# Patient Record
Sex: Female | Born: 1980 | Race: White | Hispanic: No | Marital: Married | Smoking: Current every day smoker
Health system: Southern US, Academic
[De-identification: ages and names within clinical notes are randomized; demographics above are authoritative.]

## PROBLEM LIST (undated history)

## (undated) DIAGNOSIS — Z8571 Personal history of Hodgkin lymphoma: Secondary | ICD-10-CM

## (undated) DIAGNOSIS — K219 Gastro-esophageal reflux disease without esophagitis: Secondary | ICD-10-CM

## (undated) DIAGNOSIS — G40909 Epilepsy, unspecified, not intractable, without status epilepticus: Secondary | ICD-10-CM

## (undated) DIAGNOSIS — I671 Cerebral aneurysm, nonruptured: Secondary | ICD-10-CM

## (undated) DIAGNOSIS — R569 Unspecified convulsions: Secondary | ICD-10-CM

## (undated) DIAGNOSIS — C8190 Hodgkin lymphoma, unspecified, unspecified site: Secondary | ICD-10-CM

## (undated) DIAGNOSIS — E039 Hypothyroidism, unspecified: Secondary | ICD-10-CM

## (undated) DIAGNOSIS — N979 Female infertility, unspecified: Secondary | ICD-10-CM

## (undated) DIAGNOSIS — Z8041 Family history of malignant neoplasm of ovary: Secondary | ICD-10-CM

## (undated) DIAGNOSIS — E079 Disorder of thyroid, unspecified: Secondary | ICD-10-CM

## (undated) DIAGNOSIS — Q283 Other malformations of cerebral vessels: Secondary | ICD-10-CM

## (undated) DIAGNOSIS — G935 Compression of brain: Secondary | ICD-10-CM

## (undated) HISTORY — DX: Cerebral aneurysm, nonruptured: I67.1

## (undated) HISTORY — DX: Personal history of Hodgkin lymphoma: Z85.71

## (undated) HISTORY — DX: Gastro-esophageal reflux disease without esophagitis: K21.9

## (undated) HISTORY — PX: HX INTRACRANIAL ANEURYSM REPAIR: SHX164

## (undated) HISTORY — DX: Epilepsy, unspecified, not intractable, without status epilepticus (CMS HCC): G40.909

## (undated) HISTORY — DX: Hypothyroidism, unspecified: E03.9

## (undated) HISTORY — DX: Disorder of thyroid, unspecified: E07.9

## (undated) HISTORY — DX: Compression of brain: G93.5

## (undated) HISTORY — PX: THYROID SURGERY: SHX805

## (undated) HISTORY — DX: Other malformations of cerebral vessels: Q28.3

## (undated) HISTORY — PX: BRAIN SURGERY: SHX531

## (undated) HISTORY — DX: Female infertility, unspecified: N97.9

## (undated) HISTORY — DX: Family history of malignant neoplasm of ovary: Z80.41

## (undated) HISTORY — DX: Hodgkin lymphoma, unspecified, unspecified site: C81.90

## (undated) HISTORY — PX: DENTAL SURGERY: SHX609

---

## 2002-12-08 ENCOUNTER — Emergency Department (HOSPITAL_COMMUNITY): Admission: EM | Admit: 2002-12-08 | Discharge: 2002-12-08 | Payer: Self-pay | Admitting: Emergency Medicine

## 2004-07-17 ENCOUNTER — Emergency Department: Payer: Self-pay | Admitting: Unknown Physician Specialty

## 2004-07-18 ENCOUNTER — Emergency Department (HOSPITAL_COMMUNITY): Admission: EM | Admit: 2004-07-18 | Discharge: 2004-07-19 | Payer: Self-pay | Admitting: *Deleted

## 2004-08-03 ENCOUNTER — Emergency Department: Payer: Self-pay | Admitting: Emergency Medicine

## 2004-08-03 ENCOUNTER — Other Ambulatory Visit: Payer: Self-pay

## 2004-11-09 ENCOUNTER — Emergency Department: Payer: Self-pay | Admitting: Emergency Medicine

## 2004-11-10 ENCOUNTER — Ambulatory Visit: Payer: Self-pay | Admitting: Emergency Medicine

## 2004-12-20 ENCOUNTER — Emergency Department: Payer: Self-pay | Admitting: Emergency Medicine

## 2005-03-09 ENCOUNTER — Emergency Department: Payer: Self-pay | Admitting: Emergency Medicine

## 2005-03-12 ENCOUNTER — Other Ambulatory Visit: Payer: Self-pay

## 2005-03-13 ENCOUNTER — Observation Stay: Payer: Self-pay | Admitting: Internal Medicine

## 2005-09-10 ENCOUNTER — Emergency Department: Payer: Self-pay | Admitting: Emergency Medicine

## 2005-10-12 ENCOUNTER — Emergency Department: Payer: Self-pay | Admitting: Emergency Medicine

## 2006-01-11 ENCOUNTER — Ambulatory Visit: Payer: Self-pay | Admitting: Oncology

## 2006-01-26 ENCOUNTER — Ambulatory Visit: Payer: Self-pay | Admitting: Oncology

## 2006-02-02 ENCOUNTER — Ambulatory Visit: Payer: Self-pay | Admitting: Vascular Surgery

## 2006-06-09 ENCOUNTER — Ambulatory Visit: Payer: Self-pay | Admitting: Oncology

## 2006-06-28 ENCOUNTER — Ambulatory Visit: Payer: Self-pay | Admitting: Oncology

## 2006-08-09 ENCOUNTER — Emergency Department: Payer: Self-pay | Admitting: Unknown Physician Specialty

## 2007-08-22 ENCOUNTER — Ambulatory Visit: Payer: Self-pay | Admitting: Gastroenterology

## 2009-01-03 ENCOUNTER — Ambulatory Visit: Payer: Self-pay | Admitting: Family Medicine

## 2010-02-13 ENCOUNTER — Emergency Department: Payer: Self-pay | Admitting: Emergency Medicine

## 2012-02-16 ENCOUNTER — Ambulatory Visit: Payer: Self-pay | Admitting: Family Medicine

## 2013-01-07 ENCOUNTER — Emergency Department: Payer: Self-pay | Admitting: Emergency Medicine

## 2013-01-07 LAB — URINALYSIS, COMPLETE
Bacteria: NONE SEEN
Bilirubin,UR: NEGATIVE
Glucose,UR: NEGATIVE mg/dL (ref 0–75)
Ketone: NEGATIVE
Leukocyte Esterase: NEGATIVE
Nitrite: NEGATIVE
Ph: 6 (ref 4.5–8.0)
Protein: NEGATIVE
RBC,UR: 1 /HPF (ref 0–5)
Specific Gravity: 1.011 (ref 1.003–1.030)
Squamous Epithelial: <1
WBC UR: NONE SEEN /HPF (ref 0–5)

## 2013-01-07 LAB — COMPREHENSIVE METABOLIC PANEL
Albumin: 3.6 g/dL (ref 3.4–5.0)
Alkaline Phosphatase: 60 U/L (ref 50–136)
Anion Gap: 6 — ABNORMAL LOW (ref 7–16)
BUN: 6 mg/dL — ABNORMAL LOW (ref 7–18)
Bilirubin,Total: 0.2 mg/dL (ref 0.2–1.0)
Calcium, Total: 8.5 mg/dL (ref 8.5–10.1)
Chloride: 110 mmol/L — ABNORMAL HIGH (ref 98–107)
Co2: 24 mmol/L (ref 21–32)
Creatinine: 0.6 mg/dL (ref 0.60–1.30)
EGFR (African American): >60
EGFR (Non-African Amer.): >60
Glucose: 77 mg/dL (ref 65–99)
Osmolality: 276 (ref 275–301)
Potassium: 3.5 mmol/L (ref 3.5–5.1)
SGOT(AST): 15 U/L (ref 15–37)
SGPT (ALT): 13 U/L (ref 12–78)
Sodium: 140 mmol/L (ref 136–145)
Total Protein: 7 g/dL (ref 6.4–8.2)

## 2013-01-07 LAB — PROTIME-INR
INR: 1
Prothrombin Time: 13.2 secs (ref 11.5–14.7)

## 2013-01-07 LAB — CBC WITH DIFFERENTIAL/PLATELET
Basophil #: 0.1 10*3/uL (ref 0.0–0.1)
Basophil %: 0.8 %
Eosinophil #: 0.1 10*3/uL (ref 0.0–0.7)
Eosinophil %: 1.6 %
HCT: 40.9 % (ref 35.0–47.0)
HGB: 13.6 g/dL (ref 12.0–16.0)
Lymphocyte #: 2.5 10*3/uL (ref 1.0–3.6)
Lymphocyte %: 32.8 %
MCH: 31.2 pg (ref 26.0–34.0)
MCHC: 33.1 g/dL (ref 32.0–36.0)
MCV: 94 fL (ref 80–100)
Monocyte #: 0.7 x10 3/mm (ref 0.2–0.9)
Monocyte %: 8.7 %
Neutrophil #: 4.2 10*3/uL (ref 1.4–6.5)
Neutrophil %: 56.1 %
Platelet: 231 10*3/uL (ref 150–440)
RBC: 4.35 10*6/uL (ref 3.80–5.20)
RDW: 13.2 % (ref 11.5–14.5)
WBC: 7.5 10*3/uL (ref 3.6–11.0)

## 2013-01-07 LAB — DRUG SCREEN, URINE

## 2013-01-07 LAB — TSH: Thyroid Stimulating Horm: 1.43 u[IU]/mL

## 2013-01-07 LAB — PHENYTOIN LEVEL, TOTAL: Dilantin: <0.4 ug/mL — ABNORMAL LOW (ref 10.0–20.0)

## 2013-02-15 ENCOUNTER — Emergency Department: Payer: Self-pay | Admitting: Emergency Medicine

## 2013-04-30 ENCOUNTER — Emergency Department: Payer: Self-pay | Admitting: Emergency Medicine

## 2013-04-30 LAB — BASIC METABOLIC PANEL
Anion Gap: 6 — ABNORMAL LOW (ref 7–16)
BUN: 11 mg/dL (ref 7–18)
Calcium, Total: 9.5 mg/dL (ref 8.5–10.1)
Chloride: 106 mmol/L (ref 98–107)
Co2: 26 mmol/L (ref 21–32)
Creatinine: 0.69 mg/dL (ref 0.60–1.30)
EGFR (African American): >60
EGFR (Non-African Amer.): >60
Glucose: 81 mg/dL (ref 65–99)
Osmolality: 274 (ref 275–301)
Potassium: 3.8 mmol/L (ref 3.5–5.1)
Sodium: 138 mmol/L (ref 136–145)

## 2013-04-30 LAB — CBC WITH DIFFERENTIAL/PLATELET
Basophil #: 0.1 10*3/uL (ref 0.0–0.1)
Basophil %: 0.9 %
Eosinophil #: 0.3 10*3/uL (ref 0.0–0.7)
Eosinophil %: 2.6 %
HCT: 42.3 % (ref 35.0–47.0)
HGB: 14.6 g/dL (ref 12.0–16.0)
Lymphocyte #: 3.2 10*3/uL (ref 1.0–3.6)
Lymphocyte %: 30.3 %
MCH: 31.6 pg (ref 26.0–34.0)
MCHC: 34.4 g/dL (ref 32.0–36.0)
MCV: 92 fL (ref 80–100)
Monocyte #: 0.8 x10 3/mm (ref 0.2–0.9)
Monocyte %: 7.5 %
Neutrophil #: 6.2 10*3/uL (ref 1.4–6.5)
Neutrophil %: 58.7 %
Platelet: 273 10*3/uL (ref 150–440)
RBC: 4.61 10*6/uL (ref 3.80–5.20)
RDW: 13.7 % (ref 11.5–14.5)
WBC: 10.6 10*3/uL (ref 3.6–11.0)

## 2013-04-30 LAB — PHENYTOIN LEVEL, TOTAL: Dilantin: <0.4 ug/mL — ABNORMAL LOW (ref 10.0–20.0)

## 2013-08-11 DIAGNOSIS — C8190 Hodgkin lymphoma, unspecified, unspecified site: Secondary | ICD-10-CM | POA: Insufficient documentation

## 2014-11-09 ENCOUNTER — Ambulatory Visit: Payer: Self-pay | Admitting: Family Medicine

## 2015-10-09 DIAGNOSIS — G950 Syringomyelia and syringobulbia: Secondary | ICD-10-CM | POA: Insufficient documentation

## 2015-10-09 DIAGNOSIS — G935 Compression of brain: Secondary | ICD-10-CM | POA: Insufficient documentation

## 2016-01-03 DIAGNOSIS — Q283 Other malformations of cerebral vessels: Secondary | ICD-10-CM | POA: Insufficient documentation

## 2017-02-21 ENCOUNTER — Emergency Department (HOSPITAL_COMMUNITY): Payer: Self-pay

## 2017-02-21 ENCOUNTER — Emergency Department (HOSPITAL_COMMUNITY)
Admission: EM | Admit: 2017-02-21 | Discharge: 2017-02-21 | Disposition: A | Discharge status: Home / Self Care | Payer: Self-pay | Attending: Emergency Medicine | Admitting: Emergency Medicine

## 2017-02-21 ENCOUNTER — Encounter (HOSPITAL_COMMUNITY): Payer: Self-pay

## 2017-02-21 DIAGNOSIS — Z79899 Other long term (current) drug therapy: Secondary | ICD-10-CM | POA: Insufficient documentation

## 2017-02-21 DIAGNOSIS — R531 Weakness: Secondary | ICD-10-CM

## 2017-02-21 DIAGNOSIS — Z5181 Encounter for therapeutic drug level monitoring: Secondary | ICD-10-CM | POA: Insufficient documentation

## 2017-02-21 DIAGNOSIS — R2681 Unsteadiness on feet: Secondary | ICD-10-CM

## 2017-02-21 HISTORY — DX: Unspecified convulsions: R56.9

## 2017-02-21 LAB — CBC
HCT: 41.5 % (ref 36.0–46.0)
Hemoglobin: 13.9 g/dL (ref 12.0–15.0)
MCH: 32.9 pg (ref 26.0–34.0)
MCHC: 33.5 g/dL (ref 30.0–36.0)
MCV: 98.1 fL (ref 78.0–100.0)
Platelets: 202 10*3/uL (ref 150–400)
RBC: 4.23 MIL/uL (ref 3.87–5.11)
RDW: 13.3 % (ref 11.5–15.5)
WBC: 9.8 10*3/uL (ref 4.0–10.5)

## 2017-02-21 LAB — PROTIME-INR
INR: 1
Prothrombin Time: 13.2 seconds (ref 11.4–15.2)

## 2017-02-21 LAB — URINALYSIS, ROUTINE W REFLEX MICROSCOPIC
Bilirubin Urine: NEGATIVE
Glucose, UA: NEGATIVE mg/dL
Ketones, ur: NEGATIVE mg/dL
Leukocytes, UA: NEGATIVE
Nitrite: NEGATIVE
Protein, ur: NEGATIVE mg/dL
Specific Gravity, Urine: 1.008 (ref 1.005–1.030)
pH: 6 (ref 5.0–8.0)

## 2017-02-21 LAB — RAPID URINE DRUG SCREEN, HOSP PERFORMED
Amphetamines: NOT DETECTED
Barbiturates: NOT DETECTED
Benzodiazepines: NOT DETECTED
Cocaine: NOT DETECTED
Opiates: NOT DETECTED
Tetrahydrocannabinol: NOT DETECTED

## 2017-02-21 LAB — DIFFERENTIAL
Basophils Absolute: 0 10*3/uL (ref 0.0–0.1)
Basophils Relative: 0 %
Eosinophils Absolute: 0.2 10*3/uL (ref 0.0–0.7)
Eosinophils Relative: 2 %
Lymphocytes Relative: 28 %
Lymphs Abs: 2.7 10*3/uL (ref 0.7–4.0)
Monocytes Absolute: 0.6 10*3/uL (ref 0.1–1.0)
Monocytes Relative: 6 %
Neutro Abs: 6.3 10*3/uL (ref 1.7–7.7)
Neutrophils Relative %: 64 %

## 2017-02-21 LAB — COMPREHENSIVE METABOLIC PANEL
ALT: 9 U/L — ABNORMAL LOW (ref 14–54)
AST: 15 U/L (ref 15–41)
Albumin: 3.7 g/dL (ref 3.5–5.0)
Alkaline Phosphatase: 48 U/L (ref 38–126)
Anion gap: 8 (ref 5–15)
BUN: 6 mg/dL (ref 6–20)
CO2: 23 mmol/L (ref 22–32)
Calcium: 9.1 mg/dL (ref 8.9–10.3)
Chloride: 111 mmol/L (ref 101–111)
Creatinine, Ser: 0.71 mg/dL (ref 0.44–1.00)
GFR calc Af Amer: >60 mL/min (ref >60)
GFR calc non Af Amer: >60 mL/min (ref >60)
Glucose, Bld: 94 mg/dL (ref 65–99)
Potassium: 3.5 mmol/L (ref 3.5–5.1)
Sodium: 142 mmol/L (ref 135–145)
Total Bilirubin: 0.3 mg/dL (ref 0.3–1.2)
Total Protein: 6.5 g/dL (ref 6.5–8.1)

## 2017-02-21 LAB — I-STAT CHEM 8, ED
BUN: 8 mg/dL (ref 6–20)
Calcium, Ion: 1.15 mmol/L (ref 1.15–1.40)
Chloride: 109 mmol/L (ref 101–111)
Creatinine, Ser: 0.6 mg/dL (ref 0.44–1.00)
Glucose, Bld: 85 mg/dL (ref 65–99)
HCT: 42 % (ref 36.0–46.0)
Hemoglobin: 14.3 g/dL (ref 12.0–15.0)
Potassium: 3.6 mmol/L (ref 3.5–5.1)
Sodium: 142 mmol/L (ref 135–145)
TCO2: 26 mmol/L (ref 0–100)

## 2017-02-21 LAB — PREGNANCY, URINE: Preg Test, Ur: NEGATIVE

## 2017-02-21 LAB — I-STAT TROPONIN, ED: Troponin i, poc: 0 ng/mL (ref 0.00–0.08)

## 2017-02-21 LAB — APTT: aPTT: 28 seconds (ref 24–36)

## 2017-02-21 LAB — PHENYTOIN LEVEL, TOTAL: Phenytoin Lvl: <2.5 ug/mL — ABNORMAL LOW (ref 10.0–20.0)

## 2017-02-21 LAB — I-STAT CG4 LACTIC ACID, ED: Lactic Acid, Venous: 0.85 mmol/L (ref 0.5–1.9)

## 2017-02-21 MED ORDER — PROCHLORPERAZINE EDISYLATE 5 MG/ML IJ SOLN
10.0000 mg | Freq: Once | INTRAMUSCULAR | Status: AC
  Administered 2017-02-21: 10 mg via INTRAVENOUS
  Filled 2017-02-21: qty 2

## 2017-02-21 MED ORDER — MIDAZOLAM HCL 2 MG/2ML IJ SOLN
3.0000 mg | Freq: Once | INTRAMUSCULAR | Status: DC
  Filled 2017-02-21: qty 4

## 2017-02-21 MED ORDER — AMMONIA AROMATIC IN INHA
RESPIRATORY_TRACT | Status: AC
  Filled 2017-02-21: qty 10

## 2017-02-21 MED ORDER — IOPAMIDOL (ISOVUE-370) INJECTION 76%
INTRAVENOUS | Status: AC
  Filled 2017-02-21: qty 50

## 2017-02-21 MED ORDER — KETOROLAC TROMETHAMINE 30 MG/ML IJ SOLN
30.0000 mg | Freq: Once | INTRAMUSCULAR | Status: AC
  Administered 2017-02-21: 30 mg via INTRAVENOUS
  Filled 2017-02-21: qty 1

## 2017-02-21 NOTE — ED Triage Notes (Signed)
Per EMS: Pt complaining of L sided weakness and slurred speech. Pt states "feels funny." Pt a/o x 4 upon arrival. Per EMS: pt had 2 seizures enroute.

## 2017-02-21 NOTE — ED Provider Notes (Signed)
[place] DEPT Provider Note   CSN: [id number] Arrival date & time: 02/21/17  1558 Seen on arrival to exam room. Level V caveat, acuity of situation code stroke  An emergency department physician performed an initial assessment on this suspected stroke patient at 42.  History   Chief Complaint Chief Complaint  Patient presents with  . Code Stroke    HPI Gina Greer is a 36 y.o. female.Patient reports she was normal until approximately 1 PM today when she developed headache and had recent "" seizure lasting several seconds and developed left-sided weakness. No treatment prior to coming here. EMS reports patient had 2 seizures en route Associated symptoms include photophobia.  HPI  Past Medical History:  Diagnosis Date  . Seizures ([address])     There are no active problems to display for this patient.   No past surgical history on file.  OB History    No data available       Home Medications    Prior to Admission medications   Not on File    Family History No family history on file.  Social History Social History  Substance Use Topics  . Smoking status: Not on file  . Smokeless tobacco: Not on file  . Alcohol use Not on file   Positive smoker no alcohol no drugs  Allergies   Patient has no allergy information on record.   Review of Systems Review of Systems  Unable to perform ROS: Acuity of condition  Eyes: Positive for photophobia.  Neurological: Positive for headaches.     Physical Exam Updated Vital Signs BP 128/85   Pulse 64   Resp 17   SpO2 98%   Physical Exam  Constitutional: She appears well-developed and well-nourished.  Appears mildly anxious  HENT:  Head: Normocephalic and atraumatic.  Eyes: Conjunctivae are normal. Pupils are equal, round, and reactive to light.  Neck: Neck supple. No tracheal deviation present. No thyromegaly present.  Cardiovascular: Normal rate and regular rhythm.   No murmur heard. Pulmonary/Chest:  Effort normal and breath sounds normal.  Abdominal: Soft. Bowel sounds are normal. She exhibits no distension. There is no tenderness.  Musculoskeletal: Normal range of motion. She exhibits no edema or tenderness.  Neurological: She is alert. Coordination normal.  Finger-nose normal DTR symmetric bilaterally at knee jerk ankle jerk and biceps toes to order bilaterally at speech slightly slurred.  Skin: Skin is warm and dry. No rash noted.  Nursing note and vitals reviewed.   5 PM I attempted to ambulate patient. Gait very unsteady  5:30 PM I continued to ambulate patient she is somewhat sleepy arousable easily gentle tactile stimulus. Gait continues to be unsteady. MRI of brain ordered ED Treatments / Results  Labs (all labs ordered are listed, but only abnormal results are displayed) Labs Reviewed  PROTIME-INR  APTT  CBC  DIFFERENTIAL  COMPREHENSIVE METABOLIC PANEL  I-STAT [place], ED  CBG MONITORING, ED  I-STAT CHEM 8, ED    EKG  EKG Interpretation  Date/Time:  Sunday Feb 21 2017 16:26:49 EDT Ventricular Rate:  59 PR Interval:    QRS Duration: 92 QT Interval:  419 QTC Calculation: 415 R Axis:   49 Text Interpretation:  Sinus rhythm Baseline wander in lead(s) I II aVR No significant change since last tracing Confirmed by Orlie Dakin [phone]) on 02/21/2017 11:13:52 PM      Results for orders placed or performed during the hospital encounter of 02/21/17  Protime-INR  Result Value Ref Range  Prothrombin Time 13.2 11.4 - 15.2 seconds   INR 1.00   APTT  Result Value Ref Range   aPTT 28 24 - 36 seconds  CBC  Result Value Ref Range   WBC 9.8 4.0 - 10.5 K/uL   RBC 4.23 3.87 - 5.11 MIL/uL   Hemoglobin 13.9 12.0 - 15.0 g/dL   HCT 41.5 36.0 - 46.0 %   MCV 98.1 78.0 - 100.0 fL   MCH 32.9 26.0 - 34.0 pg   MCHC 33.5 30.0 - 36.0 g/dL   RDW 13.3 11.5 - 15.5 %   Platelets 202 150 - 400 K/uL  Differential  Result Value Ref Range   Neutrophils Relative % 64 %   Neutro  Abs 6.3 1.7 - 7.7 K/uL   Lymphocytes Relative 28 %   Lymphs Abs 2.7 0.7 - 4.0 K/uL   Monocytes Relative 6 %   Monocytes Absolute 0.6 0.1 - 1.0 K/uL   Eosinophils Relative 2 %   Eosinophils Absolute 0.2 0.0 - 0.7 K/uL   Basophils Relative 0 %   Basophils Absolute 0.0 0.0 - 0.1 K/uL  Comprehensive metabolic panel  Result Value Ref Range   Sodium 142 135 - 145 mmol/L   Potassium 3.5 3.5 - 5.1 mmol/L   Chloride 111 101 - 111 mmol/L   CO2 23 22 - 32 mmol/L   Glucose, Bld 94 65 - 99 mg/dL   BUN 6 6 - 20 mg/dL   Creatinine, Ser 0.71 0.44 - 1.00 mg/dL   Calcium 9.1 8.9 - 10.3 mg/dL   Total Protein 6.5 6.5 - 8.1 g/dL   Albumin 3.7 3.5 - 5.0 g/dL   AST 15 15 - 41 U/L   ALT 9 (L) 14 - 54 U/L   Alkaline Phosphatase 48 38 - 126 U/L   Total Bilirubin 0.3 0.3 - 1.2 mg/dL   GFR calc non Af Amer >60 >60 mL/min   GFR calc Af Amer >60 >60 mL/min   Anion gap 8 5 - 15  Phenytoin level, total  Result Value Ref Range   Phenytoin Lvl <2.5 (L) 10.0 - 20.0 ug/mL  Rapid urine drug screen (hospital performed)  Result Value Ref Range   Opiates NONE DETECTED NONE DETECTED   Cocaine NONE DETECTED NONE DETECTED   Benzodiazepines NONE DETECTED NONE DETECTED   Amphetamines NONE DETECTED NONE DETECTED   Tetrahydrocannabinol NONE DETECTED NONE DETECTED   Barbiturates NONE DETECTED NONE DETECTED  Pregnancy, urine  Result Value Ref Range   Preg Test, Ur NEGATIVE NEGATIVE  Urinalysis, Routine w reflex microscopic  Result Value Ref Range   Color, Urine YELLOW YELLOW   APPearance CLEAR CLEAR   Specific Gravity, Urine 1.008 1.005 - 1.030   pH 6.0 5.0 - 8.0   Glucose, UA NEGATIVE NEGATIVE mg/dL   Hgb urine dipstick MODERATE (A) NEGATIVE   Bilirubin Urine NEGATIVE NEGATIVE   Ketones, ur NEGATIVE NEGATIVE mg/dL   Protein, ur NEGATIVE NEGATIVE mg/dL   Nitrite NEGATIVE NEGATIVE   Leukocytes, UA NEGATIVE NEGATIVE   RBC / HPF 0-5 0 - 5 RBC/hpf   WBC, UA 0-5 0 - 5 WBC/hpf   Bacteria, UA RARE (A) NONE SEEN    Squamous Epithelial / LPF 0-5 (A) NONE SEEN  I-stat troponin, ED  Result Value Ref Range   Troponin i, poc 0.00 0.00 - 0.08 ng/mL   Comment 3          I-Stat Chem 8, ED  Result Value Ref Range   Sodium 142 135 -  145 mmol/L   Potassium 3.6 3.5 - 5.1 mmol/L   Chloride 109 101 - 111 mmol/L   BUN 8 6 - 20 mg/dL   Creatinine, Ser 0.60 0.44 - 1.00 mg/dL   Glucose, Bld 85 65 - 99 mg/dL   Calcium, Ion 1.15 1.15 - 1.40 mmol/L   TCO2 26 0 - 100 mmol/L   Hemoglobin 14.3 12.0 - 15.0 g/dL   HCT 42.0 36.0 - 46.0 %  I-Stat CG4 Lactic Acid, ED  Result Value Ref Range   Lactic Acid, Venous 0.85 0.5 - 1.9 mmol/L   Mr Brain Wo Contrast  Result Date: 02/21/2017 CLINICAL DATA:  Possible seizure.  Headache.  Unsteady gait. EXAM: MRI HEAD WITHOUT CONTRAST TECHNIQUE: Multiplanar, multiecho pulse sequences of the brain and surrounding structures were obtained without intravenous contrast. COMPARISON:  Head CT 02/21/2017.  Neck CT 11/10/2004. FINDINGS: Thin section coronal oblique T2 weighted sequence through the temporal lobes was not obtained as the patient could not tolerate any additional imaging. Brain: There is no evidence of acute infarct, midline shift, or extra-axial fluid collection. The ventricles and sulci are normal. Corresponding to the hyperdense lesion in the right lentiform nucleus on CT is a 9 mm T2 hyperintense mass with complete hemosiderin ring consistent with a cavernoma. There is no surrounding edema. Thin tubular focus of low T1 signal extending from the cavernoma superiorly towards the right lateral ventricle likely represents an associated developmental venous anomaly, partially included on the prior neck CT. A small right-sided choroidal fissure cyst is incidentally noted. The brain is otherwise normal in signal. Vascular: Major intracranial vascular flow voids are preserved. Skull and upper cervical spine: Prior suboccipital craniectomy. Sinuses/Orbits: Unremarkable orbits. Mild bilateral  ethmoid air cell mucosal thickening. Clear mastoid air cells. Other: None. IMPRESSION: 1. No acute intracranial abnormality. 2. Right basal ganglia cavernoma. Electronically Signed   By: Logan Bores M.D.   On: 02/21/2017 21:28   Ct Head Code Stroke W/o Cm  Result Date: 02/21/2017 CLINICAL DATA:  Code stroke. Slurred speech. Seizure like activity. Symptoms not clearly localizing to 1 cerebral hemisphere. Known cavernoma. EXAM: CT HEAD WITHOUT CONTRAST TECHNIQUE: Contiguous axial images were obtained from the base of the skull through the vertex without intravenous contrast. COMPARISON:  01/07/2013 head CT.  11/10/2004 neck CT. FINDINGS: Brain: There is a 9 mm round hyperdense focus involving the right lentiform nucleus. A small amount of faint density was present in this location on the prior head CT, and prominent branching vascularity was partially evident in this region on the prior neck CT, with overall appearance most compatible with a cavernoma and developmental venous anomaly. There is no evidence of significant surrounding edema. There is no evidence of acute cortically based infarct, midline shift, or extra-axial fluid collection. The ventricles and sulci are normal. Vascular: No hyperdense vessel. Skull: Interval suboccipital craniectomy. Sinuses/Orbits: Mild bilateral ethmoid air cell mucosal thickening. Clear mastoid air cells. Unremarkable orbits. Other: None. ASPECTS [facility] Stroke Program Early CT Score) Not scored to to lack of localizing symptoms. IMPRESSION: 1. No acute intracranial abnormality identified. 2. 9 mm hyperdense mass in the right basal ganglia compatible with the known cavernoma. These results were called by telephone at the time of interpretation on 02/21/2017 at 4:28 pm to Dr. Leonel Ramsay, who verbally acknowledged these results. Electronically Signed   By: Logan Bores M.D.   On: 02/21/2017 16:30   Radiology Ct Head Code Stroke W/o Cm  Result Date: 02/21/2017 CLINICAL DATA:   Code stroke. Slurred speech.  Seizure like activity. Symptoms not clearly localizing to 1 cerebral hemisphere. Known cavernoma. EXAM: CT HEAD WITHOUT CONTRAST TECHNIQUE: Contiguous axial images were obtained from the base of the skull through the vertex without intravenous contrast. COMPARISON:  01/07/2013 head CT.  11/10/2004 neck CT. FINDINGS: Brain: There is a 9 mm round hyperdense focus involving the right lentiform nucleus. A small amount of faint density was present in this location on the prior head CT, and prominent branching vascularity was partially evident in this region on the prior neck CT, with overall appearance most compatible with a cavernoma and developmental venous anomaly. There is no evidence of significant surrounding edema. There is no evidence of acute cortically based infarct, midline shift, or extra-axial fluid collection. The ventricles and sulci are normal. Vascular: No hyperdense vessel. Skull: Interval suboccipital craniectomy. Sinuses/Orbits: Mild bilateral ethmoid air cell mucosal thickening. Clear mastoid air cells. Unremarkable orbits. Other: None. ASPECTS [facility] Stroke Program Early CT Score) Not scored to to lack of localizing symptoms. IMPRESSION: 1. No acute intracranial abnormality identified. 2. 9 mm hyperdense mass in the right basal ganglia compatible with the known cavernoma. These results were called by telephone at the time of interpretation on 02/21/2017 at 4:28 pm to Dr. Leonel Ramsay, who verbally acknowledged these results. Electronically Signed   By: Logan Bores M.D.   On: 02/21/2017 16:30    Procedures Procedures (including critical care time)  Medications Ordered in ED Medications  ammonia inhalant (not administered)  prochlorperazine (COMPAZINE) injection 10 mg (10 mg Intravenous Given 02/21/17 1637)  ketorolac (TORADOL) 30 MG/ML injection 30 mg (30 mg Intravenous Given 02/21/17 1637)     Initial Impression / Assessment and Plan / ED Course  I have  reviewed the triage vital signs and the nursing notes.  Pertinent labs & imaging results that were available during my care of the patient were reviewed by me and considered in my medical decision making (see chart for details).     10:05 PM patient is awake alert Glasgow Coma Score 15 ambulates without difficulty Suspect conversion reaction versus migraine. Plan follow-up with [facility] clinic as needed Final Clinical Impressions(s) / ED Diagnoses  Diagnosis #1 weakness #2 headache Final diagnoses:  None    New Prescriptions New Prescriptions   No medications on file     Orlie Dakin, MD 02/21/17 2314

## 2017-02-21 NOTE — Consult Note (Signed)
Neurology Consultation Reason for Consult: Possible seizures Referring Physician: Lyn Hollingshead  CC: Possible seizures  History is obtained from: EMS patient  HPI: Gina Greer is a 36 y.o. female with a history of seizures, cavernous malformation, previous Chiari I status post surgery, Hodgkin's lymphoma who presents with "feeling weird" since sometime before work which started at 58 AM. She states that she continue feeling worse, and then EMS was called. After EMS arrival, she had 2 brief episodes of arm contraction lasting about 15 seconds with immediate return to consciousness which EMS reported as "seizures." She then began to develop left-sided weakness and therefore a code stroke was called.  On arrival, she had a markedly inconsistent exam with multiple findings concerning for nonorganic etiology of her symptoms. CT head was negative for acute findings.  Of note, she has a headache associated with photophobia.  LKW: Unclear but likely before 11 AM tpa given?: no, outside of window, seizures at onset    ROS: A 14 point ROS was performed and is negative except as noted in the HPI.   Past Medical History:  Diagnosis Date  . Seizures ([place])      Family history: No history of similar   Social History:  has no tobacco, alcohol, and drug history on file.   Exam: Current vital signs: BP (!) 113/59 (BP Location: Right Arm)   Pulse 68   Resp 13   SpO2 99%  Vital signs in last 24 hours: Pulse Rate:  [68-69] 68 (05/27 1630) Resp:  [13-18] 13 (05/27 1630) BP: (103-113)/(59-84) 113/59 (05/27 1630) SpO2:  [99 %] 99 % (05/27 1628)   Physical Exam  Constitutional: Appears well-developed and well-nourished.  Psych: Affect appropriate to situation Eyes: No scleral injection HENT: No OP obstrucion Head: Normocephalic.  Cardiovascular: Normal rate and regular rhythm.  Respiratory: Effort normal and breath sounds normal to anterior ascultation GI: Soft.  No distension. There is  no tenderness.  Skin: WDI  Neuro: Mental Status: Patient is awake, alert, oriented to person, place, month, year, and situation. No signs of aphasia or neglect Cranial Nerves: II: Visual Fields are full. Pupils are equal, round, and reactive to light.   III,IV, VI: EOMI without ptosis or diploplia. She keeps her eyes initially closed, and then refuses to follow with her eyes, but after multiple attempts it is clear that she does have full extraocular movements. V: Facial sensation is diminished on the left side, splits midline to pinprick on the for head VII: She initially does not smile, and hesitates with moving her left side of her face but subsequently does clearly have full strength bilaterally. VIII: hearing is intact to voice X: Uvula elevates symmetrically XI: Shoulder shrug is symmetric. XII: tongue is midline without atrophy or fasciculations.  Motor: Tone is normal. Bulk is normal. When tested directly, she gives 3/5 strength of the left arm, however with testing finger-nose-finger, she is able to perform it well without drift. Sensory: She reports decreased sensation throughout the left side including splitting the midline to pinprick on the for head Cerebellar: No ataxia on finger nose finger on either side  She repeatedly has jaw tremor which is distractible.   I have reviewed labs in epic and the results pertinent to this consultation are:   I have reviewed the images obtained: CT head-known right basal ganglia cavernous malformation  Impression: 36 year old female with findings concerning for nonorganic etiology. I do wonder if she is having a migraine with embellishment and therefore will pursue  treatment of her headache with encouragement to improve.  Recommendations: 1) Compazine/Toradol(she reports allergy to Benadryl) 2) I would encourage improvement 3) I don't think that further evaluation is needed based on her exam findings.   Roland Rack,  MD [facility] [phone]  If 7pm- 7am, please page neurology on call as listed in [place].

## 2017-02-21 NOTE — Code Documentation (Signed)
36 y.o. Female with PMHx of seizures who was stated to be in her normal state of health prior to arriving at work today at 1100. EMS reports that she told her supervisor around 1245 that she "felt weird" and went to sit down. Her coworkers noted that she had left sided weakness and an unsteady gait. EMS was notified and appreciated dysarthria, headache and reports hx of aneurysm, LUE numbness and weakness. EMS also reporting about 15 second long seizure type activity.   On arrival, patient was met at the bridge by the stroke team, labs were drawn and patient taken to CT. CT showing no acute intracranial abnormalities. ASPECTS 10. 9 mm hyperdense mass in the right basal ganglia compatible with a known cavernoma. NIHSS 4. VAN (-). See EMR for NIHSS and code stroke times. On assessment, patient states she felt bad prior to going to work at 1100 and then latter stetes she felt bad when she woke up this morning. Once back in the ED, she reports being normal when she arrived at work. LKW unable to be determined. Husband is stated to be on the way to [facility] ED. Patient drowsy with poor effort on exam. Mild dysarthria, drift to the LUE, numbness to her left face, arm and leg described by the patient as her left side "feels asleep". Patient reports headache with 6/10 pain. See pain assessment in EMR for detailed pain assessment. tPA not given d/t being out of the window. Not a thrombectomy candidate d/t being VAN (-). ED bedside handoff with ED RN Gabe.

## 2017-02-21 NOTE — Discharge Instructions (Signed)
Follow-up with [facility] as needed. MRI today showed no evidence stroke. All other tests checked out okay. Return if concern for any reason

## 2018-01-30 DIAGNOSIS — Z79899 Other long term (current) drug therapy: Secondary | ICD-10-CM | POA: Insufficient documentation

## 2018-01-30 DIAGNOSIS — N390 Urinary tract infection, site not specified: Secondary | ICD-10-CM | POA: Insufficient documentation

## 2018-01-31 ENCOUNTER — Emergency Department
Admission: EM | Admit: 2018-01-31 | Discharge: 2018-01-31 | Disposition: A | Discharge status: Home / Self Care | Payer: Self-pay | Attending: Emergency Medicine | Admitting: Emergency Medicine

## 2018-01-31 ENCOUNTER — Emergency Department: Payer: Self-pay

## 2018-01-31 ENCOUNTER — Other Ambulatory Visit: Payer: Self-pay

## 2018-01-31 DIAGNOSIS — R1030 Lower abdominal pain, unspecified: Secondary | ICD-10-CM

## 2018-01-31 DIAGNOSIS — N390 Urinary tract infection, site not specified: Secondary | ICD-10-CM

## 2018-01-31 DIAGNOSIS — R52 Pain, unspecified: Secondary | ICD-10-CM

## 2018-01-31 LAB — URINALYSIS, COMPLETE (UACMP) WITH MICROSCOPIC
Bilirubin Urine: NEGATIVE
Glucose, UA: NEGATIVE mg/dL
Ketones, ur: NEGATIVE mg/dL
Nitrite: NEGATIVE
Protein, ur: 30 mg/dL — AB
RBC / HPF: >50 RBC/hpf — ABNORMAL HIGH (ref 0–5)
Specific Gravity, Urine: 1.028 (ref 1.005–1.030)
pH: 5 (ref 5.0–8.0)

## 2018-01-31 LAB — COMPREHENSIVE METABOLIC PANEL
ALT: 15 U/L (ref 14–54)
AST: 22 U/L (ref 15–41)
Albumin: 3.9 g/dL (ref 3.5–5.0)
Alkaline Phosphatase: 50 U/L (ref 38–126)
Anion gap: 8 (ref 5–15)
BUN: 11 mg/dL (ref 6–20)
CO2: 24 mmol/L (ref 22–32)
Calcium: 8.8 mg/dL — ABNORMAL LOW (ref 8.9–10.3)
Chloride: 107 mmol/L (ref 101–111)
Creatinine, Ser: 0.75 mg/dL (ref 0.44–1.00)
GFR calc Af Amer: >60 mL/min (ref >60)
GFR calc non Af Amer: >60 mL/min (ref >60)
Glucose, Bld: 98 mg/dL (ref 65–99)
Potassium: 3.3 mmol/L — ABNORMAL LOW (ref 3.5–5.1)
Sodium: 139 mmol/L (ref 135–145)
Total Bilirubin: 0.2 mg/dL — ABNORMAL LOW (ref 0.3–1.2)
Total Protein: 7.3 g/dL (ref 6.5–8.1)

## 2018-01-31 LAB — LIPASE, BLOOD: Lipase: 29 U/L (ref 11–51)

## 2018-01-31 LAB — CBC
HCT: 43.8 % (ref 35.0–47.0)
Hemoglobin: 14.7 g/dL (ref 12.0–16.0)
MCH: 33.2 pg (ref 26.0–34.0)
MCHC: 33.6 g/dL (ref 32.0–36.0)
MCV: 98.9 fL (ref 80.0–100.0)
Platelets: 179 10*3/uL (ref 150–440)
RBC: 4.43 MIL/uL (ref 3.80–5.20)
RDW: 13.6 % (ref 11.5–14.5)
WBC: 9.8 10*3/uL (ref 3.6–11.0)

## 2018-01-31 LAB — POCT PREGNANCY, URINE: Preg Test, Ur: NEGATIVE

## 2018-01-31 MED ORDER — ONDANSETRON HCL 4 MG/2ML IJ SOLN
4.0000 mg | Freq: Once | INTRAMUSCULAR | Status: AC
  Administered 2018-01-31: 4 mg via INTRAVENOUS

## 2018-01-31 MED ORDER — FENTANYL CITRATE (PF) 100 MCG/2ML IJ SOLN
50.0000 ug | Freq: Once | INTRAMUSCULAR | Status: AC
  Administered 2018-01-31: 50 ug via INTRAVENOUS
  Filled 2018-01-31: qty 2

## 2018-01-31 MED ORDER — ONDANSETRON HCL 4 MG/2ML IJ SOLN
4.0000 mg | Freq: Once | INTRAMUSCULAR | Status: DC | PRN
  Filled 2018-01-31: qty 2

## 2018-01-31 MED ORDER — FOSFOMYCIN TROMETHAMINE 3 G PO PACK
3.0000 g | PACK | Freq: Once | ORAL | Status: AC
  Administered 2018-01-31: 3 g via ORAL
  Filled 2018-01-31: qty 3

## 2018-01-31 MED ORDER — SODIUM CHLORIDE 0.9 % IV BOLUS
1000.0000 mL | Freq: Once | INTRAVENOUS | Status: AC
  Administered 2018-01-31: 1000 mL via INTRAVENOUS

## 2018-01-31 NOTE — ED Notes (Signed)
Patient transported to [facility] 

## 2018-01-31 NOTE — ED Provider Notes (Signed)
[facility] Emergency Department Provider Note   ____________________________________________   First MD Initiated Contact with Patient 01/31/18 0014     (approximate)  I have reviewed the triage vital signs and the nursing notes.   HISTORY  Chief Complaint Abdominal Pain    HPI Gina Greer is a 37 y.o. female who presents to the ED from home with a chief complaint of abdominal pain.  Patient reports sudden onset of right lower quadrant abdominal pain approximately 45 minutes prior to arrival.  Pain feels better when she puts pressure on it.  States she was in her normal state of health at work and getting ready for bed.  Has a remote history of ovarian cyst requiring surgery.  Denies recent fever, chills, chest pain, shortness of breath, nausea, vomiting, hematuria, diarrhea.  Denies recent travel or trauma.   Past Medical History:  Diagnosis Date  . Seizures ([place])     There are no active problems to display for this patient.    Prior to Admission medications   Medication Sig Start Date End Date Taking? Authorizing Provider  aspirin-acetaminophen-caffeine (EXCEDRIN MIGRAINE) [phone] MG tablet Take by mouth every 6 (six) hours as needed for headache.    [provider]  LEVOTHYROXINE SODIUM PO Take by mouth.    [provider]    Allergies Benadryl  [diphenhydramine] and Diazepam  No family history on file.  Social History Social History   Tobacco Use  . Smoking status: Not on file  Substance Use Topics  . Alcohol use: Not on file  . Drug use: Not on file    Review of Systems  Constitutional: No fever/chills. Eyes: No visual changes. ENT: No sore throat. Cardiovascular: Denies chest pain. Respiratory: Denies shortness of breath. Gastrointestinal: Positive for abdominal pain.  No nausea, no vomiting.  No diarrhea.  No constipation. Genitourinary: Negative for dysuria. Musculoskeletal: Negative for back  pain. Skin: Negative for rash. Neurological: Negative for headaches, focal weakness or numbness.   ____________________________________________   PHYSICAL EXAM:  VITAL SIGNS: ED Triage Vitals [01/31/18 0007]  Enc Vitals Group     BP (!) 153/82     Pulse Rate 77     Resp 18     Temp 97.6 F (36.4 C)     Temp Source Oral     SpO2 100 %     Weight 171 lb (77.6 kg)     Height 5\' 1"  (1.549 m)     Head Circumference      Peak Flow      Pain Score 9     Pain Loc      Pain Edu?      Excl. in [place]?     Constitutional: Alert and oriented. Well appearing and in moderate acute distress. Eyes: Conjunctivae are normal. PERRL. EOMI. Head: Atraumatic. Nose: No congestion/rhinnorhea. Mouth/Throat: Mucous membranes are moist.  Oropharynx non-erythematous. Neck: No stridor.   Cardiovascular: Normal rate, regular rhythm. Grossly normal heart sounds.  Good peripheral circulation. Respiratory: Normal respiratory effort.  No retractions. Lungs CTAB. Gastrointestinal: Soft and mildly tender to palpation right upper quadrant, umbilicus and right lower quadrant without rebound or guarding. No distention. No abdominal bruits.  Mild right CVA tenderness. Musculoskeletal: No lower extremity tenderness nor edema.  No joint effusions. Neurologic:  Normal speech and language. No gross focal neurologic deficits are appreciated. No gait instability. Skin:  Skin is warm, dry and intact. No rash noted. Psychiatric: Mood and affect are normal. Speech and  behavior are normal.  ____________________________________________   LABS (all labs ordered are listed, but only abnormal results are displayed)  Labs Reviewed  COMPREHENSIVE METABOLIC PANEL - Abnormal; Notable for the following components:      Result Value   Potassium 3.3 (*)    Calcium 8.8 (*)    Total Bilirubin 0.2 (*)    All other components within normal limits  URINALYSIS, COMPLETE (UACMP) WITH MICROSCOPIC - Abnormal; Notable for the  following components:   Color, Urine YELLOW (*)    APPearance CLOUDY (*)    Hgb urine dipstick LARGE (*)    Protein, ur 30 (*)    Leukocytes, UA TRACE (*)    RBC / HPF >50 (*)    Bacteria, UA RARE (*)    All other components within normal limits  LIPASE, BLOOD  CBC  POC URINE PREG, ED  POCT PREGNANCY, URINE   ____________________________________________  EKG  None ____________________________________________  RADIOLOGY  ED MD interpretation: CT scan unremarkable.  8 mm appendix without inflammatory changes.  Pelvic ultrasound unremarkable.   Official radiology report(s): US Pelvis Transvanginal Non-ob (tv Only)  Result Date: 01/31/2018 CLINICAL DATA:  37 year old female with right adnexal pain. EXAM: TRANSABDOMINAL AND TRANSVAGINAL ULTRASOUND OF PELVIS DOPPLER ULTRASOUND OF OVARIES TECHNIQUE: Both transabdominal and transvaginal ultrasound examinations of the pelvis were performed. Transabdominal technique was performed for global imaging of the pelvis including uterus, ovaries, adnexal regions, and pelvic cul-de-sac. It was necessary to proceed with endovaginal exam following the transabdominal exam to visualize the endometrium and ovaries. Color and duplex Doppler ultrasound was utilized to evaluate blood flow to the ovaries. COMPARISON:  CT of the abdomen pelvis dated 01/31/2018 FINDINGS: Uterus Measurements: 5.5 x 3.1 x 4.2 cm. No fibroids or other mass visualized. Endometrium Thickness: 4 mm.  No focal abnormality visualized. Right ovary Measurements: 3.2 x 1.8 x 2.4 cm. Normal appearance/no adnexal mass. Left ovary Measurements: 3.0 x 2.0 x 1.8 cm. Normal appearance/no adnexal mass. Pulsed Doppler evaluation of both ovaries demonstrates normal low-resistance arterial and venous waveforms. Other findings No abnormal free fluid. IMPRESSION: Unremarkable pelvic ultrasound. Doppler detected arterial and venous flow to both ovaries. Electronically Signed   By: Anner Crete M.D.    On: 01/31/2018 03:51   US Pelvis Complete  Result Date: 01/31/2018 CLINICAL DATA:  37 year old female with right adnexal pain. EXAM: TRANSABDOMINAL AND TRANSVAGINAL ULTRASOUND OF PELVIS DOPPLER ULTRASOUND OF OVARIES TECHNIQUE: Both transabdominal and transvaginal ultrasound examinations of the pelvis were performed. Transabdominal technique was performed for global imaging of the pelvis including uterus, ovaries, adnexal regions, and pelvic cul-de-sac. It was necessary to proceed with endovaginal exam following the transabdominal exam to visualize the endometrium and ovaries. Color and duplex Doppler ultrasound was utilized to evaluate blood flow to the ovaries. COMPARISON:  CT of the abdomen pelvis dated 01/31/2018 FINDINGS: Uterus Measurements: 5.5 x 3.1 x 4.2 cm. No fibroids or other mass visualized. Endometrium Thickness: 4 mm.  No focal abnormality visualized. Right ovary Measurements: 3.2 x 1.8 x 2.4 cm. Normal appearance/no adnexal mass. Left ovary Measurements: 3.0 x 2.0 x 1.8 cm. Normal appearance/no adnexal mass. Pulsed Doppler evaluation of both ovaries demonstrates normal low-resistance arterial and venous waveforms. Other findings No abnormal free fluid. IMPRESSION: Unremarkable pelvic ultrasound. Doppler detected arterial and venous flow to both ovaries. Electronically Signed   By: Anner Crete M.D.   On: 01/31/2018 03:51   US Pelvic Doppler (torsion R/o Or Mass Arterial Flow)  Result Date: 01/31/2018 CLINICAL DATA:  37 year old female  with right adnexal pain. EXAM: TRANSABDOMINAL AND TRANSVAGINAL ULTRASOUND OF PELVIS DOPPLER ULTRASOUND OF OVARIES TECHNIQUE: Both transabdominal and transvaginal ultrasound examinations of the pelvis were performed. Transabdominal technique was performed for global imaging of the pelvis including uterus, ovaries, adnexal regions, and pelvic cul-de-sac. It was necessary to proceed with endovaginal exam following the transabdominal exam to visualize the  endometrium and ovaries. Color and duplex Doppler ultrasound was utilized to evaluate blood flow to the ovaries. COMPARISON:  CT of the abdomen pelvis dated 01/31/2018 FINDINGS: Uterus Measurements: 5.5 x 3.1 x 4.2 cm. No fibroids or other mass visualized. Endometrium Thickness: 4 mm.  No focal abnormality visualized. Right ovary Measurements: 3.2 x 1.8 x 2.4 cm. Normal appearance/no adnexal mass. Left ovary Measurements: 3.0 x 2.0 x 1.8 cm. Normal appearance/no adnexal mass. Pulsed Doppler evaluation of both ovaries demonstrates normal low-resistance arterial and venous waveforms. Other findings No abnormal free fluid. IMPRESSION: Unremarkable pelvic ultrasound. Doppler detected arterial and venous flow to both ovaries. Electronically Signed   By: Anner Crete M.D.   On: 01/31/2018 03:51   Ct Renal Stone Study  Result Date: 01/31/2018 CLINICAL DATA:  37 y/o  F; sudden onset right lower abdominal pain. EXAM: CT ABDOMEN AND PELVIS WITHOUT CONTRAST TECHNIQUE: Multidetector CT imaging of the abdomen and pelvis was performed following the standard protocol without IV contrast. COMPARISON:  None. FINDINGS: Lower chest: No acute abnormality. Hepatobiliary: No focal liver abnormality is seen. No gallstones, gallbladder wall thickening, or biliary dilatation. Pancreas: Unremarkable. No pancreatic ductal dilatation or surrounding inflammatory changes. Spleen: Normal in size without focal abnormality. Adrenals/Urinary Tract: Adrenal glands are unremarkable. Kidneys are normal, without renal calculi, focal lesion, or hydronephrosis. Bladder is unremarkable. Stomach/Bowel: Stomach is within normal limits. Appendix measures to 8 mm, no secondary signs of acute appendicitis. No evidence of bowel wall thickening, distention, or inflammatory changes. Vascular/Lymphatic: Mild aortic atherosclerosis. No enlarged abdominal or pelvic lymph nodes. Reproductive: Uterus and bilateral adnexa are unremarkable. Other: No abdominal  wall hernia or abnormality. No abdominopelvic ascites. Musculoskeletal: No acute or significant osseous findings. IMPRESSION: 1. Appendix measures up to 8 mm, but no secondary signs of acute appendicitis, likely normal variant. 2. No urinary stone disease or hydronephrosis. 3. Mild aortic atherosclerosis. Electronically Signed   By: Kristine Garbe M.D.   On: 01/31/2018 01:38    ____________________________________________   PROCEDURES  Procedure(s) performed: None  Procedures  Critical Care performed: No  ____________________________________________   INITIAL IMPRESSION / ASSESSMENT AND PLAN / ED COURSE  As part of my medical decision making, I reviewed the following data within the [place] notes reviewed and incorporated, Labs reviewed, Old chart reviewed, Radiograph reviewed and Notes from prior ED visits   37 year old female who presents with sudden onset right lower quadrant abdominal pain. Differential diagnosis includes, but is not limited to, ovarian cyst, ovarian torsion, acute appendicitis, diverticulitis, urinary tract infection/pyelonephritis, endometriosis, bowel obstruction, colitis, renal colic, gastroenteritis, hernia, fibroids, endometriosis, pregnancy related pain including ectopic pregnancy, etc.   Will obtain screening lab work including LFTs and lipase, initiate IV fluid resuscitation, IV fentanyl for pain and IV Zofran for antiemetic.  Patient is currently in moderate distress with right flank, upper, umbilical and lower quadrant pain.  Will start with CT renal colic study; consider pelvic ultrasound if adnexal abnormality on CT scan.  Clinical Course as of Feb 01 419  Mon Jan 31, 2018  0231 Patient sleeping in no acute distress.  Updated her of CT imaging results.  Patient is afebrile with normal white count.  Given the sudden nature of her pain, lack of white count and temperature, I have low suspicion for appendicitis.  Will  send patient for pelvic ultrasound for better characterization of right adnexa.   [JS]  0410 Patient sleeping.  Awakened her to update of ultrasound result.  Fosfomycin packet given prior to discharge.  Very strict appendicitis return precautions given.  Patient and boyfriend verbalize understanding and agree with plan of care.   [JS]    Clinical Course User Index [JS] Paulette Blanch, MD     ____________________________________________   FINAL CLINICAL IMPRESSION(S) / ED DIAGNOSES  Final diagnoses:  Pain  Lower abdominal pain  Lower urinary tract infectious disease     ED Discharge Orders    None       Note:  This document was prepared using Dragon voice recognition software and may include unintentional dictation errors.    Paulette Blanch, MD 01/31/18 [phone]

## 2018-01-31 NOTE — Discharge Instructions (Signed)
Return to the ER for worsening symptoms, pain in your right lower abdomen, fever, vomiting or other concerns.

## 2018-01-31 NOTE — ED Triage Notes (Signed)
Pt states that she was laying in bed and had sudden onset of right lower abd pain, pt is dry heaving at this time, reports feeling like she is going to pass out. Pt pressing her rt side in attempt to relieve pain

## 2018-08-05 ENCOUNTER — Emergency Department: Payer: BLUE CROSS/BLUE SHIELD

## 2018-08-05 ENCOUNTER — Encounter: Payer: Self-pay | Admitting: Radiology

## 2018-08-05 ENCOUNTER — Emergency Department
Admission: EM | Admit: 2018-08-05 | Discharge: 2018-08-05 | Disposition: A | Discharge status: Home / Self Care | Payer: BLUE CROSS/BLUE SHIELD | Attending: Emergency Medicine | Admitting: Emergency Medicine

## 2018-08-05 DIAGNOSIS — L03211 Cellulitis of face: Secondary | ICD-10-CM | POA: Diagnosis not present

## 2018-08-05 DIAGNOSIS — Z79899 Other long term (current) drug therapy: Secondary | ICD-10-CM | POA: Insufficient documentation

## 2018-08-05 DIAGNOSIS — K047 Periapical abscess without sinus: Secondary | ICD-10-CM | POA: Insufficient documentation

## 2018-08-05 DIAGNOSIS — K0889 Other specified disorders of teeth and supporting structures: Secondary | ICD-10-CM | POA: Diagnosis present

## 2018-08-05 LAB — CBC WITH DIFFERENTIAL/PLATELET
Abs Immature Granulocytes: 0.06 10*3/uL (ref 0.00–0.07)
Basophils Absolute: 0.1 10*3/uL (ref 0.0–0.1)
Basophils Relative: 1 %
Eosinophils Absolute: 0.1 10*3/uL (ref 0.0–0.5)
Eosinophils Relative: 1 %
HCT: 48.1 % — ABNORMAL HIGH (ref 36.0–46.0)
Hemoglobin: 15.9 g/dL — ABNORMAL HIGH (ref 12.0–15.0)
Immature Granulocytes: 1 %
Lymphocytes Relative: 20 %
Lymphs Abs: 2.5 10*3/uL (ref 0.7–4.0)
MCH: 32.8 pg (ref 26.0–34.0)
MCHC: 33.1 g/dL (ref 30.0–36.0)
MCV: 99.2 fL (ref 80.0–100.0)
Monocytes Absolute: 0.8 10*3/uL (ref 0.1–1.0)
Monocytes Relative: 7 %
Neutro Abs: 9 10*3/uL — ABNORMAL HIGH (ref 1.7–7.7)
Neutrophils Relative %: 70 %
Platelets: 215 10*3/uL (ref 150–400)
RBC: 4.85 MIL/uL (ref 3.87–5.11)
RDW: 12.8 % (ref 11.5–15.5)
WBC: 12.5 10*3/uL — ABNORMAL HIGH (ref 4.0–10.5)
nRBC: 0 % (ref 0.0–0.2)

## 2018-08-05 LAB — COMPREHENSIVE METABOLIC PANEL
ALT: 18 U/L (ref 0–44)
AST: 19 U/L (ref 15–41)
Albumin: 4.1 g/dL (ref 3.5–5.0)
Alkaline Phosphatase: 50 U/L (ref 38–126)
Anion gap: 10 (ref 5–15)
BUN: 9 mg/dL (ref 6–20)
CO2: 21 mmol/L — ABNORMAL LOW (ref 22–32)
Calcium: 9 mg/dL (ref 8.9–10.3)
Chloride: 109 mmol/L (ref 98–111)
Creatinine, Ser: 0.66 mg/dL (ref 0.44–1.00)
GFR calc Af Amer: >60 mL/min (ref >60)
GFR calc non Af Amer: >60 mL/min (ref >60)
Glucose, Bld: 79 mg/dL (ref 70–99)
Potassium: 4 mmol/L (ref 3.5–5.1)
Sodium: 140 mmol/L (ref 135–145)
Total Bilirubin: 0.8 mg/dL (ref 0.3–1.2)
Total Protein: 7.3 g/dL (ref 6.5–8.1)

## 2018-08-05 MED ORDER — CLINDAMYCIN HCL 300 MG PO CAPS: 300.0000 mg | ORAL_CAPSULE | Freq: Three times a day (TID) | ORAL | 0 refills | Status: AC

## 2018-08-05 MED ORDER — ONDANSETRON 4 MG PO TBDP
4.0000 mg | ORAL_TABLET | Freq: Once | ORAL | Status: AC
  Administered 2018-08-05: 4 mg via ORAL
  Filled 2018-08-05: qty 1

## 2018-08-05 MED ORDER — IOHEXOL 300 MG/ML  SOLN
75.0000 mL | Freq: Once | INTRAMUSCULAR | Status: AC | PRN
  Administered 2018-08-05: 75 mL via INTRAVENOUS
  Filled 2018-08-05: qty 75

## 2018-08-05 MED ORDER — LORAZEPAM 2 MG/ML IJ SOLN
2.0000 mg | Freq: Once | INTRAMUSCULAR | Status: AC
  Administered 2018-08-05: 2 mg via INTRAVENOUS
  Filled 2018-08-05: qty 1

## 2018-08-05 MED ORDER — CLINDAMYCIN PHOSPHATE 600 MG/50ML IV SOLN
600.0000 mg | Freq: Once | INTRAVENOUS | Status: AC
  Administered 2018-08-05: 600 mg via INTRAVENOUS
  Filled 2018-08-05: qty 50

## 2018-08-05 NOTE — ED Notes (Signed)
Recollect green sent to lab 

## 2018-08-05 NOTE — ED Triage Notes (Signed)
Pt presents with swollen jaw area, pt is A/o NAD pt has hx of gland infections

## 2018-08-05 NOTE — Discharge Instructions (Addendum)
OPTIONS FOR DENTAL FOLLOW UP CARE ° °[place] [facility] and [facility] - Local Safety Net Dental Clinics °http://www.ncdhhs.gov/dph/oralhealth/services/safetynetclinics.htm °  °[facility] (336-562-3123) ° °[facility] [place] ([phone]) ° °[facility] ([phone] ext [phone]) ° °[facility] ([phone]) ° °[facility] ([phone]) °This clinic caters to the indigent population and is on a lottery system. °Location: °[facility], [address], [place] °Clinic Hours: °Wednesdays from 6pm - 9pm, patients seen by a lottery system. °For dates, call or go to [email or web address].[facility].edu/[facility]/patients/Dental-[facility] °Services: °Cleanings, fillings and simple extractions. °Payment Options: °DENTAL WORK IS FREE OF CHARGE. Bring proof of income or support. °Best way to get seen: °Arrive at 5:15 pm - this is a lottery, NOT first come/first serve, so arriving earlier will not increase your chances of being seen. °  °  °[facility] °[phone] °Select option 1 for emergencies °  °Location: °[facility], [address], [place] °Clinic Hours: °No walk-ins accepted - call the day before to schedule an appointment. °Check in times are 9:30 am and 1:30 pm. °Services: °Simple extractions, temporary fillings, pulpectomy/pulp debridement, uncomplicated abscess drainage. °Payment Options: °PAYMENT IS DUE AT THE TIME OF SERVICE.  Fee is usually $100-200, additional surgical procedures (e.g. abscess drainage) may be extra. °Cash, checks, Visa/MasterCard accepted.  Can file Medicaid if patient is covered for dental - patient should call case worker to check. °No discount for [facility] patients. °Best way to get seen: °MUST call the day before and get onto the schedule. Can usually be seen the next 1-2 days. No walk-ins accepted. °  °  °[facility] °[phone] °   °Location: °[facility], [address], [place] °Clinic Hours: °M, W, Th, F 8am or 1:30pm, Tues 9a or 1:30 - first come/first served. °Services: °Simple extractions, temporary fillings, uncomplicated abscess drainage.  You do not need to be an [place] resident. °Payment Options: °PAYMENT IS DUE AT THE TIME OF SERVICE. °Dental insurance, otherwise sliding scale - bring proof of income or support. °Depending on income and treatment needed, cost is usually $50-200. °Best way to get seen: °Arrive early as it is first come/first served. °  °  °[facility] °[phone] °  °Location: °[address] °Clinic Hours: °Mon-Thu 8a-5p °Services: °Most basic dental services including extractions and fillings. °Payment Options: °PAYMENT IS DUE AT THE TIME OF SERVICE. °Sliding scale, up to 50% off - bring proof if income or support. °Medicaid with dental option accepted. °Best way to get seen: °Call to schedule an appointment, can usually be seen within 2 weeks OR they will try to see walk-ins - show up at 8a or 2p (you may have to wait). °  °  °[facility] °[phone] °[place] RESIDENTS ONLY °  °Location: °[facility], [address], [place], [place] [address] °Clinic Hours: By appointment only. °Monday - Thursday 8am-5pm, Friday 8am-12pm °Services: Cleanings, fillings, extractions. °Payment Options: °PAYMENT IS DUE AT THE TIME OF SERVICE. °Cash, Visa or MasterCard. Sliding scale - $30 minimum per service. °Best way to get seen: °Come in to office, complete packet and make an appointment - need proof of income °or support monies for each household member and proof of [place] residence. °Usually takes about a month to get in. °  °  °[facility] °[phone] °  °Location: °[address]   [place] °Clinic Hours: Walk-in Urgent Care Dental Services are offered Monday-Friday  mornings only. °The numbers of emergencies accepted daily is limited to the number of °providers available. °Maximum 15 - Mondays, Wednesdays & Thursdays °Maximum 10 - Tuesdays & Fridays °Services: °You do not need to be a [place] resident to be seen for a dental emergency. °Emergencies are defined as pain, swelling, abnormal bleeding, or dental trauma. Walkins will receive x-rays if needed. °NOTE: Dental cleaning is not an emergency. °Payment Options: °PAYMENT IS DUE AT THE TIME OF SERVICE. °Minimum co-pay is $40.00 for uninsured patients. °Minimum co-pay is $3.00 for Medicaid with dental coverage. °Dental Insurance is accepted and must be presented at time of visit. °Medicare does not cover dental. °Forms of payment: Cash, credit card, checks. °Best way to get seen: °If not previously registered with the clinic, walk-in dental registration begins at 7:15 am and is on a first come/first serve basis. °If previously registered with the clinic, call to make an appointment. °  °  °[facility] °[phone] °[place] RESIDENTS ONLY °  °Location: °[address], [place], [place] °Clinic Hours: °Mon-Thu 10a-2p °Services: Extractions only! °Payment Options: °FREE (donations accepted) - bring proof of income or support °Best way to get seen: °Call and schedule an appointment OR come at 8am on the 1st Monday of every month (except for holidays) when it is first come/first served. °  °  °[facility] °[phone] °  °Location: °[address], [place] °Clinic Hours: °Friday mornings °Services, Payment Options, Best way to get seen: °Call for info °

## 2018-08-05 NOTE — ED Provider Notes (Signed)
[facility] Emergency Department Provider Note  ____________________________________________  Time seen: Approximately 9:55 PM  I have reviewed the triage vital signs and the nursing notes.   HISTORY  Chief Complaint Facial Pain    HPI Gina Greer is a 37 y.o. female presents to the emergency department with right submandibular pain and pain underneath her tongue.  Patient reports that she was seen by her primary care provider 2 days ago and was diagnosed with sialoadenitis and was prescribed Augmentin.  Patient has been taking Augmentin as directed but reports that her symptoms have gotten worse.  Patient has also been consuming sour candies as directed.  Patient reports that it is becoming increasingly difficult to eat due to pain.  She denies dry mouth.  Patient has a history of non-Hodgkin's lymphoma that has been in remission for the past 11 years.  She denies night sweats or recent weight loss.  Patient has also been taking Tylenol.   Past Medical History:  Diagnosis Date  . Seizures ([place])     There are no active problems to display for this patient.     Prior to Admission medications   Medication Sig Start Date End Date Taking? Authorizing Provider  aspirin-acetaminophen-caffeine (EXCEDRIN MIGRAINE) [phone] MG tablet Take by mouth every 6 (six) hours as needed for headache.    [provider]  clindamycin (CLEOCIN) 300 MG capsule Take 1 capsule (300 mg total) by mouth 3 (three) times daily for 10 days. 08/05/18 08/15/18  Lannie Fields, PA-C  LEVOTHYROXINE SODIUM PO Take by mouth.    [provider]    Allergies Benadryl  [diphenhydramine] and Diazepam  No family history on file.  Social History Social History   Tobacco Use  . Smoking status: Not on file  Substance Use Topics  . Alcohol use: Not on file  . Drug use: Not on file     Review of Systems  Constitutional: No fever/chills Eyes: No visual changes. No  discharge ENT: Patient has right submandibular pain and pain underneath the tongue.  Cardiovascular: no chest pain. Respiratory: no cough. No SOB. Gastrointestinal: No abdominal pain.  No nausea, no vomiting.  No diarrhea.  No constipation. Genitourinary: Negative for dysuria. No hematuria Musculoskeletal: Negative for musculoskeletal pain. Skin: Negative for rash, abrasions, lacerations, ecchymosis. Neurological: Negative for headaches, focal weakness or numbness.  ____________________________________________   PHYSICAL EXAM:  VITAL SIGNS: ED Triage Vitals [08/05/18 1911]  Enc Vitals Group     BP (!) 137/96     Pulse Rate (!) 59     Resp 18     Temp 98.3 F (36.8 C)     Temp Source Oral     SpO2 100 %     Weight 170 lb (77.1 kg)     Height 5' (1.524 m)     Head Circumference      Peak Flow      Pain Score 8     Pain Loc      Pain Edu?      Excl. in [place]?      Constitutional: Alert and oriented. Well appearing and in no acute distress. Eyes: Conjunctivae are normal. PERRL. EOMI. Head: Atraumatic. ENT:      Ears: TMs are pearly.      Nose: No congestion/rhinnorhea.      Mouth/Throat: Mucous membranes are moist.  Patient has right submandibular edema and mild induration with significant tenderness underneath the tongue. Neck: No stridor.  No cervical spine tenderness  to palpation. Hematological/Lymphatic/Immunilogical: Palpable cervical lymphadenopathy.  Cardiovascular: Normal rate, regular rhythm. Normal S1 and S2.  Good peripheral circulation. Respiratory: Normal respiratory effort without tachypnea or retractions. Lungs CTAB. Good air entry to the bases with no decreased or absent breath sounds. Skin:  Skin is warm, dry and intact. No rash noted. Psychiatric: Mood and affect are normal. Speech and behavior are normal. Patient exhibits appropriate insight and judgement.   ____________________________________________   LABS (all labs ordered are listed, but only  abnormal results are displayed)  Labs Reviewed  CBC WITH DIFFERENTIAL/PLATELET - Abnormal; Notable for the following components:      Result Value   WBC 12.5 (*)    Hemoglobin 15.9 (*)    HCT 48.1 (*)    Neutro Abs 9.0 (*)    All other components within normal limits  COMPREHENSIVE METABOLIC PANEL - Abnormal; Notable for the following components:   CO2 21 (*)    All other components within normal limits   ____________________________________________  EKG   ____________________________________________  RADIOLOGY I personally viewed and evaluated these images as part of my medical decision making, as well as reviewing the written report by the radiologist    Ct Soft Tissue Neck W Contrast  Result Date: 08/05/2018 CLINICAL DATA:  RIGHT lower jaw pain and swelling. Assess for Ludwig's angina. History of lymphadenopathy. EXAM: CT NECK WITH CONTRAST TECHNIQUE: Multidetector CT imaging of the neck was performed using the standard protocol following the bolus administration of intravenous contrast. CONTRAST:  47mL OMNIPAQUE IOHEXOL 300 MG/ML  SOLN COMPARISON:  CT neck November 10, 2004 FINDINGS: PHARYNX AND LARYNX: Normal.  Widely patent airway. SALIVARY GLANDS: Normal. THYROID: Normal. LYMPH NODES: No lymphadenopathy by CT size criteria. 9 mm reniform reactive RIGHT level IIa lymph node. VASCULAR: Mild calcific atherosclerosis carotid bifurcations. LIMITED INTRACRANIAL: Normal. VISUALIZED ORBITS: Normal. MASTOIDS AND VISUALIZED PARANASAL SINUSES: Well-aerated. SKELETON: Absent maxillary teeth. Tooth 29 periapical abscess. 6 x 8 x 14 mm RIGHT mandible body subperiosteal abscess, and surrounding cellulitis. No subcutaneous gas or radiopaque foreign bodies. Scattered dental caries. Status post suboccipital decompressive craniectomy. UPPER CHEST: Lung apices are clear. No superior mediastinal lymphadenopathy. OTHER: None. IMPRESSION: 1. Tooth 29 periapical abscess, associated 6 x 18 x 14 mm  subperiosteal abscess. RIGHT mandible cellulitis without deep extension. Patent airway. Electronically Signed   By: Elon Alas M.D.   On: 08/05/2018 22:20    ____________________________________________    PROCEDURES  Procedure(s) performed:    Procedures    Medications  clindamycin (CLEOCIN) IVPB 600 mg (600 mg Intravenous New Bag/Given 08/05/18 2256)  LORazepam (ATIVAN) injection 2 mg (2 mg Intravenous Given 08/05/18 2107)  ondansetron (ZOFRAN-ODT) disintegrating tablet 4 mg (4 mg Oral Given 08/05/18 2107)  iohexol (OMNIPAQUE) 300 MG/ML solution 75 mL (75 mLs Intravenous Contrast Given 08/05/18 2201)     ____________________________________________   INITIAL IMPRESSION / ASSESSMENT AND PLAN / ED COURSE  Pertinent labs & imaging results that were available during my care of the patient were reviewed by me and considered in my medical decision making (see chart for details).  Review of the [place] CSRS was performed in accordance of the [place] prior to dispensing any controlled drugs.      Assessment and Plan:  Dental abscess Patient presents to the emergency department with right lower jaw swelling that seems to be worsening and pain underneath the tongue.  Patient has been previously diagnosed with sialoadenitis by primary care.  Patient underwent basic labs in the emergency department and leukocytosis with  associated left shift was identified on CBC.  CT soft tissue neck revealed a periapical abscess surrounding inferior 29 with associated right mandible cellulitis.  Patient was given IV clindamycin in the emergency department.  She was discharged with clindamycin and advised to seek care with local dentist.  Patient voiced understanding.  All patient questions were answered.    ____________________________________________  FINAL CLINICAL IMPRESSION(S) / ED DIAGNOSES  Final diagnoses:  Dental abscess  Cellulitis of jaw, right      NEW MEDICATIONS STARTED DURING THIS  VISIT:  ED Discharge Orders         Ordered    clindamycin (CLEOCIN) 300 MG capsule  3 times daily     08/05/18 2257              This chart was dictated using voice recognition software/Dragon. Despite best efforts to proofread, errors can occur which can change the meaning. Any change was purely unintentional.    Lannie Fields, PA-C 08/05/18 2327    Orbie Pyo, MD 08/06/18 Quentin Mulling

## 2018-08-05 NOTE — ED Notes (Signed)
Pt states swollen glands and has hx from same in past. Pt states she isn't able to eat or drink because of pain.

## 2018-11-28 ENCOUNTER — Encounter: Payer: Self-pay | Admitting: Emergency Medicine

## 2018-11-28 ENCOUNTER — Ambulatory Visit
Admission: EM | Admit: 2018-11-28 | Discharge: 2018-11-28 | Disposition: A | Discharge status: Home / Self Care | Payer: Worker's Compensation | Attending: Family Medicine | Admitting: Family Medicine

## 2018-11-28 ENCOUNTER — Other Ambulatory Visit: Payer: Self-pay

## 2018-11-28 DIAGNOSIS — X500XXA Overexertion from strenuous movement or load, initial encounter: Secondary | ICD-10-CM | POA: Diagnosis not present

## 2018-11-28 DIAGNOSIS — S29012A Strain of muscle and tendon of back wall of thorax, initial encounter: Secondary | ICD-10-CM

## 2018-11-28 MED ORDER — MELOXICAM 15 MG PO TABS: 15.0000 mg | ORAL_TABLET | Freq: Every day | ORAL | 0 refills | Status: DC | PRN

## 2018-11-28 MED ORDER — TIZANIDINE HCL 4 MG PO TABS: 4.0000 mg | ORAL_TABLET | Freq: Three times a day (TID) | ORAL | 0 refills | Status: DC | PRN

## 2018-11-28 NOTE — ED Triage Notes (Signed)
Pt c/o left shoulder and back pain in the center of her back. She was at work when she felt something pull about 2 weeks ago. She thought it would get better but it has not.

## 2018-11-28 NOTE — ED Provider Notes (Signed)
[facility]    CSN: [id number] Arrival date & time: 11/28/18  1011  History   Chief Complaint Chief Complaint  Patient presents with  . Shoulder Pain    left WC  . Back Pain   HPI   38 year old female presents with a Workmen's Comp. injury.  Patient states that she was at work 2 weeks ago and was pulling a pallet.  When she did so, she developed left trapezius tightness.  Subsequently developed right-sided thoracic back pain.  Patient states that she has failed to improve.  She has been using a heating pad, ibuprofen, and aspirin without relief.  She states that ice actually may need the pain worse.  She has been able to work throughout this process.  No radicular symptoms.  Patient concerned given the fact that she is not fully improved.  No other associated symptoms.  No other complaints.  PMH, Surgical Hx, Family Hx, Social History reviewed and updated as below.  Past Medical History:  Diagnosis Date  . Seizures ([place])    Past Surgical History:  Procedure Laterality Date  . BRAIN SURGERY      OB History   No obstetric history on file.    Home Medications    Prior to Admission medications   Medication Sig Start Date End Date Taking? Authorizing Provider  phenytoin (DILANTIN) 100 MG ER capsule 300 mg AM 100 mg afternoon and 300 mg PM 01/27/18  Yes [provider]  topiramate (TOPAMAX) 100 MG tablet 100 mg AM and PM for a week, then 150 mg AM and 100 mg PM for 1 week, then 150 mg BiD 01/27/18  Yes [provider]  meloxicam (MOBIC) 15 MG tablet Take 1 tablet (15 mg total) by mouth daily as needed for pain. 11/28/18   Coral Spikes, DO  tiZANidine (ZANAFLEX) 4 MG tablet Take 1 tablet (4 mg total) by mouth every 8 (eight) hours as needed for muscle spasms. 11/28/18   Coral Spikes, DO   Family History Family History  Problem Relation Age of Onset  . Cancer Mother        small cell lung cancer  . Heart failure Father    Social History Social  History   Tobacco Use  . Smoking status: Current Every Day Smoker    Packs/day: 0.25  . Smokeless tobacco: Never Used  Substance Use Topics  . Alcohol use: Not Currently  . Drug use: Not Currently    Allergies   Benadryl  [diphenhydramine]; Fentanyl; and Diazepam  Review of Systems Review of Systems  Constitutional: Negative.   Musculoskeletal: Positive for back pain.       L trapezius tightness/pain.   Physical Exam Triage Vital Signs ED Triage Vitals  Enc Vitals Group     BP 11/28/18 1047 (!) 112/59     Pulse Rate 11/28/18 1047 (!) 58     Resp 11/28/18 1047 16     Temp 11/28/18 1047 97.9 F (36.6 C)     Temp Source 11/28/18 1047 Oral     SpO2 11/28/18 1047 99 %     Weight 11/28/18 1041 168 lb (76.2 kg)     Height 11/28/18 1041 5' (1.524 m)     Head Circumference --      Peak Flow --      Pain Score 11/28/18 1041 6     Pain Loc --      Pain Edu? --      Excl. in [place]? --  Updated Vital Signs BP (!) 112/59 (BP Location: Left Arm)   Pulse (!) 58   Temp 97.9 F (36.6 C) (Oral)   Resp 16   Ht 5' (1.524 m)   Wt 76.2 kg   LMP 11/10/2018 (Approximate)   SpO2 99%   BMI 32.81 kg/m   Visual Acuity Right Eye Distance:   Left Eye Distance:   Bilateral Distance:    Right Eye Near:   Left Eye Near:    Bilateral Near:     Physical Exam Vitals signs and nursing note reviewed.  Constitutional:      General: She is not in acute distress.    Appearance: Normal appearance.  HENT:     Head: Normocephalic and atraumatic.  Eyes:     General: No scleral icterus.    Conjunctiva/sclera: Conjunctivae normal.  Neck:     Comments: Left trapezius tension and tenderness to palpation. Cardiovascular:     Rate and Rhythm: Normal rate and regular rhythm.  Pulmonary:     Effort: Pulmonary effort is normal.     Breath sounds: Normal breath sounds.  Musculoskeletal:     Comments: No discrete areas of tenderness of the thoracic spine.  Neurological:     Mental Status: She  is alert.  Psychiatric:        Mood and Affect: Mood normal.        Behavior: Behavior normal.    UC Treatments / Results  Labs (all labs ordered are listed, but only abnormal results are displayed) Labs Reviewed - No data to display  EKG None  Radiology No results found.  Procedures Procedures (including critical care time)  Medications Ordered in UC Medications - No data to display  Initial Impression / Assessment and Plan / UC Course  I have reviewed the triage vital signs and the nursing notes.  Pertinent labs & imaging results that were available during my care of the patient were reviewed by me and considered in my medical decision making (see chart for details).    38 year old female presents with a thoracic strain.  Advised heat.  Avoid heavy lifting.  Treatment of meloxicam and Zanaflex.  Advised to not take the muscle relaxer at work.  Patient states that she feels like she can return to work and did not want restrictions.  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  Final Clinical Impressions(s) / UC Diagnoses   Final diagnoses:  Strain of thoracic back region     Discharge Instructions     Heat.  Do not use aspirin or other NSAID's while taking the mobic.  No muscle relaxer's at work.   Take care  Dr. Lacinda Axon    ED Prescriptions    Medication Sig Dispense Auth. Provider   meloxicam (MOBIC) 15 MG tablet Take 1 tablet (15 mg total) by mouth daily as needed for pain. 30 tablet Chelisa Hennen G, DO   tiZANidine (ZANAFLEX) 4 MG tablet Take 1 tablet (4 mg total) by mouth every 8 (eight) hours as needed for muscle spasms. 30 tablet Coral Spikes, DO     Controlled Substance Prescriptions [place] Controlled Substance Registry consulted? Not Applicable   Coral Spikes, DO 11/28/18 1120

## 2018-11-28 NOTE — Discharge Instructions (Signed)
Heat.  Do not use aspirin or other NSAID's while taking the mobic.  No muscle relaxer's at work.   Take care  Dr. Lacinda Axon

## 2019-08-02 ENCOUNTER — Other Ambulatory Visit: Payer: Self-pay

## 2019-08-02 ENCOUNTER — Ambulatory Visit (INDEPENDENT_AMBULATORY_CARE_PROVIDER_SITE_OTHER): Payer: BC Managed Care – PPO | Admitting: Certified Nurse Midwife

## 2019-08-02 ENCOUNTER — Other Ambulatory Visit (HOSPITAL_COMMUNITY)
Admission: RE | Admit: 2019-08-02 | Discharge: 2019-08-02 | Disposition: A | Discharge status: Home / Self Care | Payer: BC Managed Care – PPO | Source: Ambulatory Visit | Attending: Certified Nurse Midwife | Admitting: Certified Nurse Midwife

## 2019-08-02 ENCOUNTER — Encounter: Payer: Self-pay | Admitting: Certified Nurse Midwife

## 2019-08-02 VITALS — BP 108/70 | HR 69 | Ht 60.0 in | Wt 174.0 lb

## 2019-08-02 DIAGNOSIS — Z124 Encounter for screening for malignant neoplasm of cervix: Secondary | ICD-10-CM | POA: Insufficient documentation

## 2019-08-02 DIAGNOSIS — Z720 Tobacco use: Secondary | ICD-10-CM

## 2019-08-02 DIAGNOSIS — Z3202 Encounter for pregnancy test, result negative: Secondary | ICD-10-CM | POA: Diagnosis not present

## 2019-08-02 DIAGNOSIS — N938 Other specified abnormal uterine and vaginal bleeding: Secondary | ICD-10-CM | POA: Diagnosis not present

## 2019-08-02 DIAGNOSIS — Z01419 Encounter for gynecological examination (general) (routine) without abnormal findings: Secondary | ICD-10-CM

## 2019-08-02 DIAGNOSIS — N915 Oligomenorrhea, unspecified: Secondary | ICD-10-CM

## 2019-08-02 DIAGNOSIS — N979 Female infertility, unspecified: Secondary | ICD-10-CM

## 2019-08-02 DIAGNOSIS — G40909 Epilepsy, unspecified, not intractable, without status epilepticus: Secondary | ICD-10-CM

## 2019-08-02 DIAGNOSIS — Q283 Other malformations of cerebral vessels: Secondary | ICD-10-CM

## 2019-08-02 LAB — POCT URINE PREGNANCY: Preg Test, Ur: NEGATIVE

## 2019-08-02 MED ORDER — FOLIC ACID 1 MG PO TABS: 1.0000 mg | ORAL_TABLET | Freq: Every day | ORAL | 10 refills | Status: AC

## 2019-08-03 LAB — PROLACTIN: Prolactin: 9.7 ng/mL (ref 4.8–23.3)

## 2019-08-03 LAB — FSH/LH
FSH: 7.2 m[IU]/mL
LH: 7.6 m[IU]/mL

## 2019-08-03 LAB — BETA HCG QUANT (REF LAB): hCG Quant: <1 m[IU]/mL

## 2019-08-09 LAB — CYTOLOGY - PAP
Comment: NEGATIVE
Diagnosis: NEGATIVE
High risk HPV: NEGATIVE

## 2019-08-17 ENCOUNTER — Ambulatory Visit: Payer: BC Managed Care – PPO | Admitting: Obstetrics & Gynecology

## 2019-08-17 DIAGNOSIS — G40909 Epilepsy, unspecified, not intractable, without status epilepticus: Secondary | ICD-10-CM | POA: Insufficient documentation

## 2019-08-17 DIAGNOSIS — C8190 Hodgkin lymphoma, unspecified, unspecified site: Secondary | ICD-10-CM | POA: Insufficient documentation

## 2019-08-17 DIAGNOSIS — Z720 Tobacco use: Secondary | ICD-10-CM | POA: Insufficient documentation

## 2019-08-20 ENCOUNTER — Encounter: Payer: Self-pay | Admitting: Certified Nurse Midwife

## 2019-08-20 DIAGNOSIS — N979 Female infertility, unspecified: Secondary | ICD-10-CM | POA: Insufficient documentation

## 2019-08-20 DIAGNOSIS — Q283 Other malformations of cerebral vessels: Secondary | ICD-10-CM | POA: Insufficient documentation

## 2019-08-20 NOTE — Progress Notes (Addendum)
Gynecology Annual Exam  PCP: [facility]  Chief Complaint:  Chief Complaint  Patient presents with  . Amenorrhea    6hr period in Sept 26; 4hr period in Oct 29    History of Present Illness: Gina Greer is a 38 y.o. Hodgkin lymphoma survivor who presents with a history of infertility with a + UPT prior to a short light menses on 29 October.  Her urine pregnancy test in the office today is negative. She has been trying to conceive for the last 16 years. She has 4 god children and is adopting a child currently.   Her menses are usually regular, they occur every month, and they normally last 3-4 days with 1-2 heavier days requiring a tampon change 3-4 times/day. Her last two menses in September and October have lasted 1 day and were very light.  Her last normal menstrual period was 05/04/2019. She usually has dysmenorrhea with her menses. She denies hot flashes  Last pap smear: about 7 years ago, results were normal per patient. No history of abnormal pap smears   The patient is sexually active. She currently uses nothing for contraception. She does not have dyspareunia.  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  The patient does perform self breast exams. .   There is no family history of breast cancer. Genetic testing  been done.   There is a family history of ovarian cancer in her MGM and paternal aunt Genetic testing has been done, but these details were not obtained at this visit.  The patient has been a ling term smoker. She has cut down her smoking from 2 PPD to 4 cigarettes/day since August.  She denies drinking.   She denies illegal drug  use.  The patient reports exercising regularly.by walking  The patient denies current symptoms of depression.    Review of Systems: Review of Systems  Constitutional: Positive for malaise/fatigue. Negative for chills, fever and weight loss.  HENT: Negative for congestion, sinus pain and sore throat.   Eyes: Negative for blurred vision and pain.  Respiratory: Negative for hemoptysis, shortness of breath and wheezing.   Cardiovascular: Negative for chest pain, palpitations and leg swelling.  Gastrointestinal: Negative for abdominal pain, blood in stool, diarrhea, heartburn, nausea and vomiting.  Genitourinary: Positive for frequency. Negative for dysuria, hematuria and urgency.       Positive for AUB  Musculoskeletal: Negative for back pain, joint pain and myalgias.  Skin: Negative for itching and rash.  Neurological: Positive for dizziness, seizures and headaches. Negative for tingling.  Endo/Heme/Allergies: Negative for environmental allergies and polydipsia. Does not bruise/bleed easily.       Negative for hirsutism   Psychiatric/Behavioral: Negative for depression. The patient is not nervous/anxious and does not have insomnia.     Past Medical History:  Past Medical History:  Diagnosis Date  . Hodgkin lymphoma ([place])    age 47  . Seizures ([place])   . Thyroid disease    underactive     Past Surgical History:  Past Surgical History:  Procedure Laterality Date  . BRAIN SURGERY    . DENTAL SURGERY     all top teeth removed age 89 and most of  bottom teeth  . THYROID SURGERY     bx; port insertion and removal    Family History:  Family History  Adopted: Yes  Problem Relation Age of Onset  . Cancer Mother        small cell lung cancer  . Heart failure Father   . Lung cancer Maternal Aunt 63  . Ovarian cancer Maternal Grandmother 72  . Dementia Maternal Grandmother   . Kidney disease Maternal Grandfather     Social History:  Social History   Socioeconomic History   . Marital status: Married    Spouse name: Not on file  . Number of children: Not on file  . Years of education: Not on file  . Highest education level: Not on file  Occupational History  . Not on file  Social Needs  . Financial resource strain: Not on file  . Food insecurity    Worry: Not on file    Inability: Not on file  . Transportation needs    Medical: Not on file    Non-medical: Not on file  Tobacco Use  . Smoking status: Current Every Day Smoker    Packs/day: 0.25  . Smokeless tobacco: Never Used  Substance and Sexual Activity  . Alcohol use: Not Currently  . Drug use: Not Currently  . Sexual activity: Yes    Birth control/protection: None  Lifestyle  . Physical activity    Days per week: Not on file    Minutes per session: Not on file  . Stress: Not on file  Relationships  . Social Herbalist on phone: Not on file    Gets together: Not on file    Attends religious service: Not on file    Active member of club or organization: Not on file    Attends meetings of clubs or organizations: Not on file    Relationship status: Not on file  . Intimate partner violence    Fear of current or ex partner: Not on file    Emotionally abused: Not on file    Physically abused: Not on file    Forced sexual activity: Not on file  Other Topics Concern  . Not on file  Social History Narrative  . Not on file    Allergies:  Allergies  Allergen Reactions  . Benadryl  [Diphenhydramine] Hives and Swelling  . Fentanyl   . Diazepam Nausea And Vomiting    Medications: Current Outpatient Medications:  .  albuterol (VENTOLIN HFA) 108 (90 Base) MCG/ACT inhaler, Inhale 2 puffs into the lungs as needed., Disp: , Rfl:  .  ranitidine (ZANTAC) 150 MG capsule, Take 150 mg by mouth daily., Disp: , Rfl:  .  aspirin-acetaminophen-caffeine (EXCEDRIN MIGRAINE) 250-250-65 MG tablet, Take 1 tablet by mouth as needed., Disp: , Rfl:  .  folic acid (FOLVITE) 1 MG tablet, Take 1 tablet  (1 mg total) by mouth daily., Disp: 30 tablet, Rfl: 10   Physical Exam Vitals: BP 108/70   Pulse 69   Ht 5' (1.524 m)   Wt 174 lb (78.9 kg)   LMP 05/04/2019 (Exact Date)   BMI 33.98 kg/m   General: NAD HEENT: normocephalic, anicteric Neck: no thyroid enlargement, no palpable nodules, no cervical lymphadenopathy  Pulmonary: No increased work of breathing, CTAB Cardiovascular: RRR, without murmur  Breast: Breast symmetrical, no tenderness, no palpable nodules or masses, no skin or nipple retraction present, no nipple discharge.  No axillary, infraclavicular or supraclavicular lymphadenopathy. Abdomen: Soft, non-tender, non-distended.  Umbilicus without lesions.  No hepatomegaly or masses palpable. No evidence of hernia. Genitourinary:  External: Normal external female genitalia.  Normal urethral meatus, normal Bartholin's and Skene's glands.    Vagina: Normal vaginal mucosa, no evidence of prolapse.    Cervix: Grossly normal in appearance, no bleeding, non-tender  Uterus:  normal size, shape, and consistency, mobile, and non-tender  Adnexa: No adnexal masses, non-tender  Rectal: deferred  Lymphatic: no evidence of inguinal lymphadenopathy Extremities: no edema, erythema, or tenderness Neurologic: Grossly intact Psychiatric: mood appropriate, affect full     Assessment: 38 y.o. with infertility/ oligomenorrhea Well woman exam  Plan:   1) Breast cancer screening - recommend monthly self breast exam. Start annual screening mammograms at age 89  2) Prolactin level, FSH/LH, AMH, beta HCG. Encouraged follow up with neurology regarding resuming antiepileptic medication-and changing to a medication that she can be on during a pregnancy. Also recommend talking with neurosurgeon regarding pregnancy efect on cavernous malformation  3) Cervical cancer screening - Pap was done.   4) Begin multivitamin or prenatal vitamin with folic acid  5) RTN to office to see MD regarding further  infertility work up.  Dalia Heading, CNM

## 2019-09-11 ENCOUNTER — Ambulatory Visit: Payer: BC Managed Care – PPO | Admitting: Obstetrics & Gynecology

## 2019-09-14 ENCOUNTER — Encounter: Payer: Self-pay | Admitting: Obstetrics and Gynecology

## 2019-10-03 ENCOUNTER — Ambulatory Visit (INDEPENDENT_AMBULATORY_CARE_PROVIDER_SITE_OTHER): Payer: BC Managed Care – PPO | Admitting: Obstetrics & Gynecology

## 2019-10-03 ENCOUNTER — Other Ambulatory Visit: Payer: Self-pay

## 2019-10-03 ENCOUNTER — Encounter: Payer: Self-pay | Admitting: Obstetrics & Gynecology

## 2019-10-03 VITALS — BP 120/80 | Ht 60.0 in | Wt 175.0 lb

## 2019-10-03 DIAGNOSIS — Z3169 Encounter for other general counseling and advice on procreation: Secondary | ICD-10-CM

## 2019-10-03 NOTE — Progress Notes (Signed)
Gynecology Infertility Exam  PCP: [facility]  Chief Complaint  Patient presents with  . Consult    History of Present Illness: Patient is a 39 y.o. G0P0000 presenting for evaluation of infertility. Patient and partner have been attempting conception for 16 years. Marital Status: married for about 16 years. Pregnancies with current partner no Husband- no other children; has had hernia surgery  Menstrual and Endocrine History LMP: Patient's last menstrual period was 09/17/2019. Menarche:not applicable Shortest Interval: 25 Longest Interval: 28  days Duration of flow: 3 days Heavy Menses: no Clots: no Intermenstrual Bleeding: no Postcoital Bleeding: no Dysmenorrhea: no Amenorrhea: not applicable Wt Change: no Hirsutism: no Balding: no Acne: yes Galactorrhea: no  Obstetrical History Never pregnant  Gynecologic History Last PAP: 07/2019 Normal Previous abdominal or pelvic surgery: no Pelvic Pain:  no Endometriosis: no Hot Flashes: no DES Exposure: no Abnormal Pap: no Cervix Cryo/cone: no STD: no PID: no  Infertility and Endocrine Studies BBT: no Endo. Bx.:no HSG: no PCT: no Laparoscopy: no Hormonal Studies: yes, normal LH, FSH Semen analysis: no Other Studies: no Meds: none Other Therapies: Not applicable Insemination:not applicable  Sexual History Frequency: a few times per month(s) Satisfied: yes Dyspareunia: no Use of Lubricant: no Douching: no Number of lifetime sex partners: 1  Contraception None  Family History Thyroid Problems: no Heart Condition or High Blood Pressure: no Blood Clot or Stroke: no Diabetes: no Cancer: yes, PT has H/O HODGINS LYMPHOMA w CHEM on 2008 Birth Defects/Inherited diseases:no Infectious diseases (mumps, TB, Rubella):no Other Medical Problems: yes - CAVERNOUS MALFORMATION, SURGERY 2018 MR/autism/fragile X or POF: no  Habits Cigarettes:    Wife -  yes    Husband - did not ask Marijuana:  no   PMHx: She  has a past medical history of Cavernous malformation, Chiari I malformation ([place]), Family history of ovarian cancer, Hodgkin lymphoma ([place]), Hypothyroidism (acquired), Infertility, female, Seizures ([place]), and Thyroid disease. Also,  has a past surgical history that includes Brain surgery; Thyroid surgery; and Dental surgery., family history includes Cancer in her mother; Dementia in her maternal grandmother; Heart failure in her father; Kidney disease in her maternal grandfather; Lung cancer (age of onset: 90) in her maternal aunt; Ovarian cancer (age of onset: 58) in her paternal aunt; Ovarian cancer (age of onset: 47) in her maternal grandmother. She was adopted.,  reports that she has been smoking. She has been smoking about 0.25 packs per day. She has never used smokeless tobacco. She reports previous alcohol use. She reports previous drug use.  She has a current medication list which includes the following prescription(s): phenytoin, topiramate, albuterol, aspirin-acetaminophen-caffeine, folic acid, and ranitidine. Also, is allergic to benadryl  [diphenhydramine]; fentanyl; and diazepam.  Review of Systems  Constitutional: Negative for chills, fever and malaise/fatigue.  HENT: Negative for congestion, sinus pain and sore throat.   Eyes: Negative for blurred vision and pain.  Respiratory: Negative for cough and wheezing.   Cardiovascular: Negative for chest pain and leg swelling.  Gastrointestinal: Negative for abdominal pain, constipation, diarrhea, heartburn, nausea and vomiting.  Genitourinary: Negative for dysuria, frequency, hematuria and urgency.  Musculoskeletal: Negative for back pain, joint pain, myalgias and neck pain.  Skin: Negative for itching and rash.  Neurological: Negative for dizziness, tremors and weakness.  Endo/Heme/Allergies: Does not bruise/bleed easily.  Psychiatric/Behavioral: Negative for depression. The patient is not nervous/anxious and does not have  insomnia.     Objective: BP 120/80   Ht 5' (1.524 m)   [facility]  175 lb (79.4 kg)   LMP 09/17/2019   BMI 34.18 kg/m  Physical Exam Constitutional:      General: She is not in acute distress.    Appearance: She is well-developed.  Musculoskeletal:        General: Normal range of motion.  Neurological:     Mental Status: She is alert and oriented to person, place, and time.  Skin:    General: Skin is warm and dry.  Vitals reviewed.      Assessment: 39 y.o. G0P0000 1. Infertility counseling - Labs done already - Exam and [place] UTD, normal - Semen Analysis, plan soon - Hysterosalpingogram (HSG) for infertility; plan after next cycle   Plan: 1) We discussed the underlying etiologies which may be implicated in a couple experiencing difficulty conceiving.  The average couple will conceive within the span of 1 year with unprotected coitus, with a monthly fecundity rate of 20% or 1 in 5.  Even without further work up or intervention the patient and her partner may be successful in conceiving unassisted, although if an underlying etiology can be identified and addressed fecundity rate may improve.  The work up entails examining for ovulatory function, tubal patency, and ruling out female factor infertility.  These may be looked at concurrently or sequentially.  The downside of sequential work up is that this method may miss issues if more than one compartment is contributing.  She is aware that tubal factor or moderate to severe female factor infertility will require further consultation with a reproductive endocrinologist.  In the case of anovulation, use of Clomid (clomiphen citrate) or Femara (letrazole) were discussed with the understanding the the later is an off-label, but well supported use.  With either of these drugs the risk of multiples increases from the standard population rate of 2% to approximately 10%, with higher order multiples possible but unlikely.  Both drugs may require some time to  titrate to the appropriate dosage to ensure consistent ovulation.  Cycles will be limited to 6 cycles on each drug secondary to decreasing rates of conception after 6 cycles.  In addition should patient be started on ovulation induction with Clomid she was advised to discontinue the drug for any vision changes as this is a rare but potentially permanent side-effect if medication is continued.  We discussed timing of intercourse as well as the use of ovulation predictor kits identify the patient's fertile window each month.     2) Preconception counseling - immunization up to date.  The patient denies any family history of conditions which would warrant preconception genetic counseling or testing on her or her partner.  Instructed to start prenatal vitamins while trying to conceive.    Will not consider IVF (cost) Will consider surgery for tubes if blocked Clomid options desired if above normal  Barnett Applebaum, MD, [facility], [facility] 10/03/2019  2:19 PM

## 2019-10-03 NOTE — Patient Instructions (Signed)
Hysterosalpingography  Hysterosalpingography is a procedure in which a doctor looks inside a woman's womb (uterus) and fallopian tubes. During the procedure, a dye is injected into the womb. Then X-rays are taken. The dye makes the womb show up on X-rays. What happens before the procedure?  Schedule the procedure after your period stops, but before your next ovulation. This is usually between day 5 and day 10 of your last period. Day 1 is the first day of your period.  Ask your doctor about changing or stopping your normal medicines. This is important if you take diabetes medicines or blood thinners.  Pee before the procedure starts.  Plan to have someone take you home. What happens during the procedure?  You may be given one of these: ? A medicine to help you relax (sedative). ? An over-the-counter pain medicine.  You will lie down on your back. Your feet will be placed in foot rests (stirrups).  A device (speculum) will be placed into your vagina. This lets your doctor see the lower part of your womb (cervix).  Your cervix will be washed with a soap that kills germs.  A medicine may be injected into your cervix to numb it (local anesthesia).  A tube will be passed into your womb.  Dye will be passed through the tube and into the womb. The dye may cause some cramps.  X-rays will be taken.  The tube will be taken out. The dye will flow out of your vagina on its own. The procedure may vary among doctors and hospitals. What happens after the procedure?  Most of the dye will flow out on its own. Wear a pad if needed.  You may have mild cramping and vaginal bleeding.  Do not drive for 24 hours if you were given a medicine to help you relax.  It is up to you to get the results of your test. Ask your doctor when your results will be ready. Summary  Hysterosalpingography is a procedure in which a doctor looks inside a woman's womb and fallopian tubes.  In this procedure, dye  is injected into the womb. Then, X-rays are taken. The dye makes the womb show up on the X-rays.  Plan to have this procedure after your period stops, but before your next ovulation. This is often between days 5 and 10 of your last period. Day 1 is the first day of your period.  After the procedure, you may have mild cramping and bleeding. Most of the dye will flow out on its own. Wear a pad if needed. This information is not intended to replace advice given to you by your health care provider. Make sure you discuss any questions you have with your health care provider. Document Revised: 08/27/2017 Document Reviewed: 10/07/2016 Elsevier Patient Education  [place].   Female Infertility  Female infertility refers to a woman's inability to get pregnant (conceive) after a year of having sex regularly (or after 6 months in women over age 53) without using birth control. Infertility can also mean that a woman is not able to carry a pregnancy to full term. Both women and men can have fertility problems. What are the causes? This condition may be caused by:  Problems with reproductive organs. Infertility can result if a woman: ? Has an abnormally short cervix or a cervix that does not remain closed during a pregnancy. ? Has a blockage or scarring in the fallopian tubes. ? Has an abnormally shaped uterus. ? Has  uterine fibroids. This is a benign mass of tissue or muscle (tumor) that can develop in the uterus. ? Is not ovulating in a regular way.  Certain medical conditions. These may include: ? Polycystic ovary syndrome (PCOS). This is a hormonal disorder that can cause small cysts to grow on the ovaries. This is the most common cause of infertility in women. ? Endometriosis. This is a condition in which the tissue that lines the uterus (endometrium) grows outside of its normal location. ? Cancer and cancer treatments, such as chemotherapy or radiation. ? Premature ovarian failure. This  is when ovaries stop producing eggs and hormones before age 62. ? Sexually transmitted diseases, such as chlamydia or gonorrhea. ? Autoimmune disorders. These are disorders in which the body's defense system (immune system) attacks normal, healthy cells. Infertility can be linked to more than one cause. For some women, the cause of infertility is not known (unexplained infertility). What increases the risk?  Age. A woman's fertility declines with age, especially after her mid-56s.  Being underweight or overweight.  Drinking too much alcohol.  Using drugs such as anabolic steroids, cocaine, and marijuana.  Exercising excessively.  Being exposed to environmental toxins, such as radiation, pesticides, and certain chemicals. What are the signs or symptoms? The main sign of infertility in women is the inability to get pregnant or carry a pregnancy to full term. How is this diagnosed? This condition may be diagnosed by:  Checking whether you are ovulating each month. The tests may include: ? Blood tests to check hormone levels. ? An ultrasound of the ovaries. ? Taking a small tissue that lines the uterus and checking it under a microscope (endometrial biopsy).  Doing additional tests. This is done if ovulation is normal. Tests may include: ? Hysterosalpingography. This X-ray test can show the shape of the uterus and whether the fallopian tubes are open. ? Laparoscopy. This test uses a lighted tube (laparoscope) to look for problems in the fallopian tubes and other organs. ? Transvaginal ultrasound. This imaging test is used to check for abnormalities in the uterus and ovaries. ? Hysteroscopy. This test uses a lighted tube to check for problems in the cervix and the uterus. To be diagnosed with infertility, both partners will have a physical exam. Both partners will also have an extensive medical and sexual history taken. Additional tests may be done. How is this treated? Treatment depends  on the cause of infertility. Most cases of infertility in women are treated with medicine or surgery.  Women may take medicine to: ? Correct ovulation problems. ? Treat other health conditions.  Surgery may be done to: ? Repair damage to the ovaries, fallopian tubes, cervix, or uterus. ? Remove growths from the uterus. ? Remove scar tissue from the uterus, pelvis, or other organs. Assisted reproductive technology (ART) Assisted reproductive technology (ART) refers to all treatments and procedures that combine eggs and sperm outside the body to try to help a couple conceive. ART is often combined with fertility drugs to stimulate ovulation. Sometimes ART is done using eggs retrieved from another woman's body (donor eggs) or from previously frozen fertilized eggs (embryos). There are different types of ART. These include:  Intrauterine insemination (IUI). A long, thin tube is used to place sperm directly into a woman's uterus. This procedure: ? Is effective for infertility caused by sperm problems, including low sperm count and low motility. ? Can be used in combination with fertility drugs.  In vitro fertilization (IVF). This is done when  a woman's fallopian tubes are blocked or when a man has low sperm count. In this procedure: ? Fertility drugs are used to stimulate the ovaries to produce multiple eggs. ? Once mature, these eggs are removed from the body and combined with the sperm to be fertilized. ? The fertilized eggs are then placed into the woman's uterus. Follow these instructions at home:  Take over-the-counter and prescription medicines only as told by your health care provider.  Do not use any products that contain nicotine or tobacco, such as cigarettes and e-cigarettes. If you need help quitting, ask your health care provider.  If you drink alcohol, limit how much you have to 1 drink a day.  Make dietary changes to lose weight or maintain a healthy weight. Work with your  health care provider and a dietitian to set a weight-loss goal that is healthy and reasonable for you.  Seek support from a counselor or support group to talk about your concerns related to infertility. Couples counseling may be helpful for you and your partner.  Practice stress reduction techniques that work well for you, such as regular physical activity, meditation, or deep breathing.  Keep all follow-up visits as told by your health care provider. This is important. Contact a health care provider if you:  Feel that stress is interfering with your life and relationships.  Have side effects from treatments for infertility. Summary  Female infertility refers to a woman's inability to get pregnant (conceive) after a year of having sex regularly (or after 6 months in women over age 45) without using birth control.  To be diagnosed with infertility, both partners will have a physical exam. Both partners will also have an extensive medical and sexual history taken.  Seek support from a counselor or support group to talk about your concerns related to infertility. Couples counseling may be helpful for you and your partner. This information is not intended to replace advice given to you by your health care provider. Make sure you discuss any questions you have with your health care provider. Document Revised: 01/05/2019 Document Reviewed: 08/16/2017 Elsevier Patient Education  [place].

## 2019-10-19 ENCOUNTER — Ambulatory Visit
Admission: RE | Admit: 2019-10-19 | Discharge: 2019-10-19 | Disposition: A | Discharge status: Home / Self Care | Payer: BC Managed Care – PPO | Source: Ambulatory Visit | Attending: Obstetrics & Gynecology | Admitting: Obstetrics & Gynecology

## 2019-10-19 ENCOUNTER — Other Ambulatory Visit: Payer: Self-pay

## 2019-10-19 DIAGNOSIS — Z3169 Encounter for other general counseling and advice on procreation: Secondary | ICD-10-CM

## 2019-11-07 ENCOUNTER — Telehealth: Payer: Self-pay

## 2019-11-07 NOTE — Telephone Encounter (Signed)
Late entry:  Pt called 11/06/19 11:34am for seman analysis results.  [phone]  Adv pt results were not in chart yet; will have it investigated.  Sandy [facility] printed final results 11/07/19 9:08am as results are still not in pt's chart. Pt aware results are back but I cannot tell her the results as Bradley hasn't seen them yet.  He is in the office in the am and I will give them to him first thing in the am.

## 2019-11-08 ENCOUNTER — Telehealth: Payer: Self-pay

## 2019-11-08 NOTE — Telephone Encounter (Signed)
Pt seen [place] for infertility counseling on 10/03/19, semen analysis order has to be in Pt's husband name Bronda Mizer per lab.. please advise

## 2019-11-09 NOTE — Telephone Encounter (Signed)
Pt is calling to get update on her husbands semen analysis results. Please advise

## 2019-11-09 NOTE — Telephone Encounter (Signed)
Pt aware.

## 2019-11-09 NOTE — Telephone Encounter (Signed)
Let her know I have just received the results and they are normal!  This is good new for him, and for them as well. Plan HSG xray soon. Dr Kenton Kingfisher

## 2019-11-09 NOTE — Telephone Encounter (Signed)
This must be new procedure for [facility].    (Normally, we order semen analysis for the spouse thru the females name as she is our patient and we dont see the female as a patient) Please have a rep come here and discuss with me, so that we can plan accordingly. Want it to be able to be done but also legally and ethically done so correctly.

## 2019-11-16 ENCOUNTER — Ambulatory Visit
Admission: RE | Admit: 2019-11-16 | Discharge: 2019-11-16 | Disposition: A | Discharge status: Home / Self Care | Payer: BC Managed Care – PPO | Source: Ambulatory Visit | Attending: Obstetrics & Gynecology | Admitting: Obstetrics & Gynecology

## 2019-11-16 ENCOUNTER — Other Ambulatory Visit: Payer: Self-pay

## 2019-11-16 DIAGNOSIS — N970 Female infertility associated with anovulation: Secondary | ICD-10-CM

## 2019-11-16 MED ORDER — IOHEXOL 300 MG/ML  SOLN
25.0000 mL | Freq: Once | INTRAMUSCULAR | Status: AC | PRN
  Administered 2019-11-16: 15 mL

## 2019-11-16 NOTE — Procedures (Signed)
Prep and drape done.  Cervix normal.  Tenaculum not needed or placed. Cervix dilated minimally.  Catheter placed into uterine cavity. Approximately 12 mL dye injected under fluoroscopic visualization.  Patent bilateral fallopian tube seen.  Catheter and tenaculum removed.  Pt stable, tolerated procedure well.  Barnett Applebaum, MD, [facility], [place] [facility] 11/16/2019  1:23 PM

## 2019-12-26 ENCOUNTER — Telehealth: Payer: Self-pay

## 2019-12-26 NOTE — Telephone Encounter (Signed)
Pt calling; had procedure last month on the 17th; has questions about this month.  [phone]  Mailbox full.

## 2019-12-27 NOTE — Telephone Encounter (Signed)
Sch virtual or tele appt to discuss fertility

## 2019-12-27 NOTE — Telephone Encounter (Signed)
Pt states she has not had a period; has had 6 negative preg tests at home; has been trying to conceive; is this normal?  Does it mean something else is wrong?  Please call - ok to leave detailed vm as she is the only one that can get to it.  Also, her mother-in-law passed away 01-19-23 so she may not be able to answer.

## 2019-12-27 NOTE — Telephone Encounter (Signed)
Patient is calling to follow up on message left. Please advise

## 2019-12-28 NOTE — Telephone Encounter (Signed)
Patient is schedule for 01/15/20

## 2019-12-28 NOTE — Telephone Encounter (Signed)
Attempt to reach patient to schedule appointment. When speaking to get patient schedule the phone call was disconnected.

## 2020-01-03 ENCOUNTER — Telehealth: Payer: Self-pay

## 2020-01-03 NOTE — Telephone Encounter (Signed)
Pt called triage to let DR Kenton Kingfisher know she started her period yesterday, she was advised to call when started, so a fertility medicine could be sent in .

## 2020-01-04 ENCOUNTER — Other Ambulatory Visit: Payer: Self-pay | Admitting: Obstetrics & Gynecology

## 2020-01-04 MED ORDER — CLOMIPHENE CITRATE 50 MG PO TABS: 50.0000 mg | ORAL_TABLET | Freq: Every day | ORAL | 0 refills | Status: AC

## 2020-01-04 NOTE — Telephone Encounter (Signed)
Tomorrow appt?  Will call in meds, but want to discuss

## 2020-01-04 NOTE — Telephone Encounter (Signed)
Change appt from 4/19 to today (tele or virtual).  Thx.

## 2020-01-04 NOTE — Telephone Encounter (Signed)
Patient is schedule for 8:20 tomorrow

## 2020-01-04 NOTE — Telephone Encounter (Signed)
Patient is unable to schedule today due to work. Patient report this menstrual cycle is heavier than normal when she first started and now it's back to normal. Patient is wanting to know if the prescription has been sent to pharmacy? Advised patient that might be why we are trying to schedule her today. Please advise

## 2020-01-05 ENCOUNTER — Ambulatory Visit: Payer: BC Managed Care – PPO | Admitting: Obstetrics & Gynecology

## 2020-01-15 ENCOUNTER — Telehealth: Payer: BC Managed Care – PPO | Admitting: Obstetrics & Gynecology

## 2020-02-01 ENCOUNTER — Other Ambulatory Visit: Payer: Self-pay | Admitting: Obstetrics & Gynecology

## 2020-05-05 ENCOUNTER — Emergency Department
Admission: EM | Admit: 2020-05-05 | Discharge: 2020-05-05 | Disposition: A | Discharge status: Home / Self Care | Payer: BC Managed Care – PPO | Attending: Emergency Medicine | Admitting: Emergency Medicine

## 2020-05-05 ENCOUNTER — Other Ambulatory Visit: Payer: Self-pay

## 2020-05-05 DIAGNOSIS — Z79899 Other long term (current) drug therapy: Secondary | ICD-10-CM | POA: Diagnosis not present

## 2020-05-05 DIAGNOSIS — F17200 Nicotine dependence, unspecified, uncomplicated: Secondary | ICD-10-CM | POA: Insufficient documentation

## 2020-05-05 DIAGNOSIS — R569 Unspecified convulsions: Secondary | ICD-10-CM

## 2020-05-05 DIAGNOSIS — E039 Hypothyroidism, unspecified: Secondary | ICD-10-CM | POA: Insufficient documentation

## 2020-05-05 LAB — CBC WITH DIFFERENTIAL/PLATELET
Abs Immature Granulocytes: 0.05 10*3/uL (ref 0.00–0.07)
Basophils Absolute: 0.1 10*3/uL (ref 0.0–0.1)
Basophils Relative: 1 %
Eosinophils Absolute: 0.1 10*3/uL (ref 0.0–0.5)
Eosinophils Relative: 1 %
HCT: 44.4 % (ref 36.0–46.0)
Hemoglobin: 14.7 g/dL (ref 12.0–15.0)
Immature Granulocytes: 0 %
Lymphocytes Relative: 16 %
Lymphs Abs: 2.1 10*3/uL (ref 0.7–4.0)
MCH: 33.2 pg (ref 26.0–34.0)
MCHC: 33.1 g/dL (ref 30.0–36.0)
MCV: 100.2 fL — ABNORMAL HIGH (ref 80.0–100.0)
Monocytes Absolute: 0.8 10*3/uL (ref 0.1–1.0)
Monocytes Relative: 6 %
Neutro Abs: 10.1 10*3/uL — ABNORMAL HIGH (ref 1.7–7.7)
Neutrophils Relative %: 76 %
Platelets: 214 10*3/uL (ref 150–400)
RBC: 4.43 MIL/uL (ref 3.87–5.11)
RDW: 13.9 % (ref 11.5–15.5)
WBC: 13.2 10*3/uL — ABNORMAL HIGH (ref 4.0–10.5)
nRBC: 0 % (ref 0.0–0.2)

## 2020-05-05 LAB — PHENYTOIN LEVEL, TOTAL: Phenytoin Lvl: <2.5 ug/mL — ABNORMAL LOW (ref 10.0–20.0)

## 2020-05-05 LAB — ETHANOL: Alcohol, Ethyl (B): <10 mg/dL (ref <10)

## 2020-05-05 LAB — BASIC METABOLIC PANEL
Anion gap: 10 (ref 5–15)
BUN: 7 mg/dL (ref 6–20)
CO2: 19 mmol/L — ABNORMAL LOW (ref 22–32)
Calcium: 8.7 mg/dL — ABNORMAL LOW (ref 8.9–10.3)
Chloride: 112 mmol/L — ABNORMAL HIGH (ref 98–111)
Creatinine, Ser: 0.69 mg/dL (ref 0.44–1.00)
GFR calc Af Amer: >60 mL/min (ref >60)
GFR calc non Af Amer: >60 mL/min (ref >60)
Glucose, Bld: 94 mg/dL (ref 70–99)
Potassium: 4.3 mmol/L (ref 3.5–5.1)
Sodium: 141 mmol/L (ref 135–145)

## 2020-05-05 LAB — POCT PREGNANCY, URINE: Preg Test, Ur: NEGATIVE

## 2020-05-05 MED ORDER — PHENYTOIN SODIUM EXTENDED 100 MG PO CAPS: ORAL_CAPSULE | ORAL | 2 refills | Status: AC

## 2020-05-05 MED ORDER — TOPIRAMATE 100 MG PO TABS: 150.0000 mg | ORAL_TABLET | Freq: Two times a day (BID) | ORAL | 2 refills | Status: AC

## 2020-05-05 NOTE — ED Notes (Signed)
Pt offered wheelchair to lobby- declines

## 2020-05-05 NOTE — ED Provider Notes (Signed)
[facility] Emergency Department Provider Note    ____________________________________________   I have reviewed the triage vital signs and the nursing notes.   HISTORY  Chief Complaint Seizures   History limited by: Not Limited   HPI Gina Greer is a 39 y.o. female who presents to the emergency department today because of concern for a seizure.  Patient states for the past week she has felt like she has been in a fog.  Today she started feeling bad and was coming go outside when apparently she had a seizure.  She does have a history of seizures.  She has not taken her Dilantin the past 2 weeks because she has run out and states that her doctor is out of town and cannot give a refill.  Patient has continued to take her Topamax.  She denies any significant pain after the seizure.  She denies any recent fevers nausea or vomiting.  Records reviewed. Per medical record review patient has a history of seizures.  Past Medical History:  Diagnosis Date  . Cavernous malformation   . Chiari I malformation ([place])   . Family history of ovarian cancer    neg genetic testing in past  . Hodgkin lymphoma ([place])    age 17  . Hypothyroidism (acquired)    off synthroid 2018 with normal TFTs of replacement  . Infertility, female   . Seizures ([place])   . Thyroid disease    underactive     Patient Active Problem List   Diagnosis Date Noted  . Infertility, female   . Cavernous malformation   . Hodgkin's lymphoma ([place]) 08/17/2019  . Seizure disorder ([place]) 08/17/2019  . Tobacco use 08/17/2019    Past Surgical History:  Procedure Laterality Date  . BRAIN SURGERY     suboccipital craniectomy for Chairi I malformation decompression  . DENTAL SURGERY     all top teeth removed age 55 and most of bottom teeth  . THYROID SURGERY     bx; port insertion and removal    Prior to Admission medications   Medication Sig Start Date End Date Taking? Authorizing Provider   albuterol (VENTOLIN HFA) 108 (90 Base) MCG/ACT inhaler Inhale 2 puffs into the lungs as needed. 12/28/18   [provider]  aspirin-acetaminophen-caffeine (EXCEDRIN MIGRAINE) [phone] MG tablet Take 1 tablet by mouth as needed.    [provider]  clomiPHENE (CLOMID) 50 MG tablet Take 1 tablet (50 mg total) by mouth daily. 01/04/20   Gae Dry, MD  folic acid (FOLVITE) 1 MG tablet Take 1 tablet (1 mg total) by mouth daily. Patient not taking: Reported on 10/03/2019 08/02/19   Dalia Heading, CNM  phenytoin (DILANTIN) 100 MG ER capsule 300 mg AM 100 mg afternoon and 300 mg PM 08/30/19   [provider]  ranitidine (ZANTAC) 150 MG capsule Take 150 mg by mouth daily.    [provider]  topiramate (TOPAMAX) 100 MG tablet Take 150 mg by mouth 2 (two) times daily. 09/01/19   [provider]    Allergies Benadryl  [diphenhydramine], Fentanyl, and Diazepam  Family History  Adopted: Yes  Problem Relation Age of Onset  . Cancer Mother        small cell lung cancer  . Heart failure Father   . Lung cancer Maternal Aunt 63  . Ovarian cancer Maternal Grandmother 59  . Dementia Maternal Grandmother   . Kidney disease Maternal Grandfather   . Ovarian cancer Paternal Aunt 78  Social History Social History   Tobacco Use  . Smoking status: Current Every Day Smoker    Packs/day: 0.25  . Smokeless tobacco: Never Used  Vaping Use  . Vaping Use: Never used  Substance Use Topics  . Alcohol use: Not Currently  . Drug use: Not Currently    Review of Systems Constitutional: No fever/chills Eyes: No visual changes. ENT: No sore throat. Cardiovascular: Denies chest pain. Respiratory: Denies shortness of breath. Gastrointestinal: No abdominal pain.  No nausea, no vomiting.  No diarrhea.   Genitourinary: Negative for dysuria. Musculoskeletal: Negative for back pain. Skin: Negative for rash. Neurological: Foggy  feeling.  ____________________________________________   PHYSICAL EXAM:  VITAL SIGNS: ED Triage Vitals  Enc Vitals Group     BP 05/05/20 1546 97/60     Pulse Rate 05/05/20 1546 75     Resp 05/05/20 1546 18     Temp 05/05/20 1546 98.1 F (36.7 C)     Temp Source 05/05/20 1546 Oral     SpO2 05/05/20 1546 99 %     Weight 05/05/20 1549 160 lb (72.6 kg)     Height 05/05/20 1549 5' (1.524 m)     Head Circumference --      Peak Flow --      Pain Score 05/05/20 1548 3    Constitutional: Alert and oriented.  Eyes: Conjunctivae are normal.  ENT      Head: Normocephalic and atraumatic.      Nose: No congestion/rhinnorhea.      Mouth/Throat: Mucous membranes are moist.      Neck: No stridor. Hematological/Lymphatic/Immunilogical: No cervical lymphadenopathy. Cardiovascular: Normal rate, regular rhythm.  No murmurs, rubs, or gallops. Respiratory: Normal respiratory effort without tachypnea nor retractions. Breath sounds are clear and equal bilaterally. No wheezes/rales/rhonchi. Gastrointestinal: Soft and non tender. No rebound. No guarding.  Genitourinary: Deferred Musculoskeletal: Normal range of motion in all extremities. No lower extremity edema. Neurologic:  Normal speech and language. No gross focal neurologic deficits are appreciated.  Skin:  Skin is warm, dry and intact. No rash noted. Psychiatric: Mood and affect are normal. Speech and behavior are normal. Patient exhibits appropriate insight and judgment.  ____________________________________________    LABS (pertinent positives/negatives)  CBC wbc 13.2, hgb 14.7, plt 214 BMP wnl except cl 112, co2 19, ca 8.7  ____________________________________________   EKG  I, Nance Pear, attending physician, personally viewed and interpreted this EKG  EKG Time: 1552 Rate: 80 Rhythm: sinus rhythm with PVCs Axis: normal Intervals: qtc 454 QRS: narrow ST changes: no st elevation Impression: abnormal  ekg  ____________________________________________    RADIOLOGY  None  ____________________________________________   PROCEDURES  Procedures  ____________________________________________   INITIAL IMPRESSION / ASSESSMENT AND PLAN / ED COURSE  Pertinent labs & imaging results that were available during my care of the patient were reviewed by me and considered in my medical decision making (see chart for details).   Patient presented to the emergency department today after having a seizure.  Patient has been off her Dilantin for the past 2 weeks.  At this point I do think that is likely the cause for the patient's seizure.  I did discuss that I would like to give her a loading dose of her Dilantin however the patient declined.  She stated that when she does get loading doses promotes seizure activity.  The patient did want to leave prior to blood work returning.  Will give patient prescription for her seizure medications.  ____________________________________________   FINAL  CLINICAL IMPRESSION(S) / ED DIAGNOSES  Final diagnoses:  Seizure [facility])     Note: This dictation was prepared with Dragon dictation. Any transcriptional errors that result from this process are unintentional     Nance Pear, MD 05/05/20 1711

## 2020-05-05 NOTE — ED Triage Notes (Signed)
Pt was working at [employer] and had a seizure while sitting down, pt does not remember if she had LOC or not. Pt is AOX4, NAD. Pt c/o right sided head pain. Pt has history of seizures and has not taken dilantin for 1 week.

## 2020-05-05 NOTE — Discharge Instructions (Addendum)
Please do not drive, go up on ladders or roofs, swim in pools or perform any activity that might be dangerous to you or others if you were to have another seizure. Please seek medical attention for any high fevers, chest pain, shortness of breath, change in behavior, persistent vomiting, bloody stool or any other new or concerning symptoms.

## 2020-05-05 NOTE — ED Notes (Signed)
Pt states she is feeling good and wants to be discharged after giving lab samples.

## 2020-10-10 ENCOUNTER — Inpatient Hospital Stay: Payer: Self-pay | Admitting: Oncology

## 2020-10-10 ENCOUNTER — Inpatient Hospital Stay: Payer: Self-pay

## 2020-10-31 ENCOUNTER — Inpatient Hospital Stay: Payer: BC Managed Care – PPO | Attending: Oncology | Admitting: Oncology

## 2020-10-31 ENCOUNTER — Encounter: Payer: Self-pay | Admitting: Oncology

## 2020-10-31 ENCOUNTER — Inpatient Hospital Stay: Payer: BC Managed Care – PPO

## 2020-10-31 VITALS — BP 107/58 | HR 53 | Temp 97.7°F | Resp 16 | Wt 175.9 lb

## 2020-10-31 DIAGNOSIS — R221 Localized swelling, mass and lump, neck: Secondary | ICD-10-CM | POA: Diagnosis not present

## 2020-10-31 DIAGNOSIS — R590 Localized enlarged lymph nodes: Secondary | ICD-10-CM | POA: Insufficient documentation

## 2020-10-31 DIAGNOSIS — Z8571 Personal history of Hodgkin lymphoma: Secondary | ICD-10-CM | POA: Diagnosis present

## 2020-11-03 NOTE — Progress Notes (Signed)
Hematology/Oncology Consult note [facility] [phone] [phone]  Patient Care Team: [facility], [facility] as PCP - General (General Practice)   Name of the patient: Gina Greer  [id number]  Nov 05, 1980    Reason for referral- concern for submental mass   Referring physician- Delight Stare   Date of visit: 11/03/20   History of presenting illness- Patient is a 40 year old female with a past medical history significant for Hodgkin's lymphoma diagnosed in May 2006 stage IIIb disease s/p 6 cycles of ABVD from June 2006 to March 2007.  She did not receive any radiation at that time.  She was following up with [facility] hematology until January 2020.  No further follow-up with them was deemed necessary at that time.  Patient presently reports pain and sometimes swelling in her bilateral submental area.  She is concerned about the possibility of cancer.  She has had a CT soft tissue neck back in November 2019 and back then she did not have any findings of cervical adenopathy.  Reported her appetite and weight have remained stable.  Reports ongoing fatigue.  She predominantly lives with her significant other in [place] but comes to [place] up occasionally during his road trips.  ECOG PS- 1  Pain scale- 3   Review of systems- Review of Systems  Constitutional: Positive for malaise/fatigue. Negative for chills, fever and weight loss.  HENT: Negative for congestion, ear discharge and nosebleeds.        Neck swelling  Eyes: Negative for blurred vision.  Respiratory: Negative for cough, hemoptysis, sputum production, shortness of breath and wheezing.   Cardiovascular: Negative for chest pain, palpitations, orthopnea and claudication.  Gastrointestinal: Negative for abdominal pain, blood in stool, constipation, diarrhea, heartburn, melena, nausea and vomiting.  Genitourinary: Negative for dysuria, flank pain, frequency, hematuria and urgency.   Musculoskeletal: Negative for back pain, joint pain and myalgias.  Skin: Negative for rash.  Neurological: Negative for dizziness, tingling, focal weakness, seizures, weakness and headaches.  Endo/Heme/Allergies: Does not bruise/bleed easily.  Psychiatric/Behavioral: Negative for depression and suicidal ideas. The patient does not have insomnia.     Allergies  Allergen Reactions  . Benadryl  [Diphenhydramine] Hives and Swelling  . Fentanyl   . Diazepam Nausea And Vomiting    Patient Active Problem List   Diagnosis Date Noted  . Infertility, female   . Cavernous malformation   . Hodgkin's lymphoma ([place]) 08/17/2019  . Seizure disorder ([place]) 08/17/2019  . Tobacco use 08/17/2019  . 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 Chiari I malformation ([place]) 10/09/2015  . Syringomyelia ([place]) 10/09/2015     Past Medical History:  Diagnosis Date  . Cavernous malformation   . Chiari I malformation ([place])   . Family history of ovarian cancer    neg genetic testing in past  . Hodgkin lymphoma ([place])    age 63  . Hypothyroidism (acquired)    off synthroid 2018 with normal TFTs of replacement  . Infertility, female   . Seizures ([place])   . Thyroid disease    underactive      Past Surgical History:  Procedure Laterality Date  . BRAIN SURGERY     suboccipital craniectomy for Chairi I malformation decompression  . DENTAL SURGERY     all top teeth removed age 63 and most of bottom teeth  . THYROID SURGERY     bx; port insertion and removal    Social History   Socioeconomic History  . Marital status: Married  Spouse name: Not on file  . Number of children: Not on file  . Years of education: Not on file  . Highest education level: Not on file  Occupational History  . Not on file  Tobacco Use  . Smoking status: Current Every Day Smoker    Packs/day: 0.25  . Smokeless tobacco: Never Used  Vaping Use  . Vaping Use: Never used  Substance and Sexual Activity  . Alcohol use:  Not Currently  . Drug use: Not Currently  . Sexual activity: Yes    Birth control/protection: None  Other Topics Concern  . Not on file  Social History Narrative  . Not on file   Social Determinants of Health   Financial Resource Strain: Not on file  Food Insecurity: Not on file  Transportation Needs: Not on file  Physical Activity: Not on file  Stress: Not on file  Social Connections: Not on file  Intimate Partner Violence: Not on file     Family History  Adopted: Yes  Problem Relation Age of Onset  . Cancer Mother        small cell lung cancer  . Heart failure Father   . Lung cancer Maternal Aunt 63  . Ovarian cancer Maternal Grandmother 30  . Dementia Maternal Grandmother   . Kidney disease Maternal Grandfather   . Ovarian cancer Paternal Aunt 79     Current Outpatient Medications:  .  albuterol (VENTOLIN HFA) 108 (90 Base) MCG/ACT inhaler, Inhale 2 puffs into the lungs as needed., Disp: , Rfl:  .  aspirin-acetaminophen-caffeine (EXCEDRIN MIGRAINE) 250-250-65 MG tablet, Take 1 tablet by mouth as needed., Disp: , Rfl:  .  clomiPHENE (CLOMID) 50 MG tablet, Take 1 tablet (50 mg total) by mouth daily., Disp: 5 tablet, Rfl: 0 .  phenytoin (DILANTIN) 100 MG ER capsule, 300 mg AM 100 mg afternoon and 300 mg PM, Disp: 100 capsule, Rfl: 2 .  ranitidine (ZANTAC) 150 MG capsule, Take 150 mg by mouth daily., Disp: , Rfl:  .  sertraline (ZOLOFT) 100 MG tablet, Take 200 mg by mouth daily., Disp: , Rfl:  .  topiramate (TOPAMAX) 100 MG tablet, Take 1.5 tablets (150 mg total) by mouth 2 (two) times daily., Disp: 60 tablet, Rfl: 2 .  folic acid (FOLVITE) 1 MG tablet, Take 1 tablet (1 mg total) by mouth daily. (Patient not taking: No sig reported), Disp: 30 tablet, Rfl: 10   Physical exam:  Vitals:   10/31/20 1359  BP: (!) 107/58  Pulse: (!) 53  Resp: 16  Temp: 97.7 F (36.5 C)  TempSrc: Tympanic  SpO2: 100%  Weight: 175 lb 14.4 oz (79.8 kg)   Physical Exam Constitutional:       General: She is not in acute distress. Eyes:     Extraocular Movements: EOM normal.     Pupils: Pupils are equal, round, and reactive to light.  Cardiovascular:     Rate and Rhythm: Normal rate and regular rhythm.     Heart sounds: Normal heart sounds.  Pulmonary:     Effort: Pulmonary effort is normal.     Breath sounds: Normal breath sounds.  Abdominal:     General: Bowel sounds are normal.     Palpations: Abdomen is soft.  Lymphadenopathy:     Comments: No palpable cervical, supraclavicular, axillary or inguinal adenopathy   Skin:    General: Skin is warm and dry.  Neurological:     Mental Status: She is alert and oriented to person, place,  and time.        CMP Latest Ref Rng & Units 05/05/2020  Glucose 70 - 99 mg/dL 94  BUN 6 - 20 mg/dL 7  Creatinine 0.44 - 1.00 mg/dL 0.69  Sodium 135 - 145 mmol/L 141  Potassium 3.5 - 5.1 mmol/L 4.3  Chloride 98 - 111 mmol/L 112(H)  CO2 22 - 32 mmol/L 19(L)  Calcium 8.9 - 10.3 mg/dL 8.7(L)  Total Protein 6.5 - 8.1 g/dL -  Total Bilirubin 0.3 - 1.2 mg/dL -  Alkaline Phos 38 - 126 U/L -  AST 15 - 41 U/L -  ALT 0 - 44 U/L -   CBC Latest Ref Rng & Units 05/05/2020  WBC 4.0 - 10.5 K/uL 13.2(H)  Hemoglobin 12.0 - 15.0 g/dL 14.7  Hematocrit 36.0 - 46.0 % 44.4  Platelets 150 - 400 K/uL 214     Assessment and plan- Patient is a 39 y.o. female with prior history of Hodgkin's lymphoma in 2006 now referred for concerns of submental swelling  On my examination today I do not palpate any cervical adenopathy or submental swelling.  A repeat CT soft tissue neck could be considered but likely will be low yield.  I have also offered the patient opinion with ENT for an NPL exam but patient would like to defer it at this time.  Also from a Hodgkin's lymphoma standpoint it has been 16 years since the diagnosis and the likelihood of recurrence at this point is very low.  If patient has persistent symptoms CT soft tissue neck can be considered by her  primary care doctor and she can be referred to [place] if there are any abnormal findings on the CT but I do not see the need to do that at this time.  She does not require oncology follow-up at this time   Thank you for this kind referral and the opportunity to participate in the care of this  Patient   Visit Diagnosis No diagnosis found.  Dr. Randa Evens, MD, MPH [facility] at [facility] [phone] 11/03/2020              ]

## 2020-12-10 ENCOUNTER — Inpatient Hospital Stay (HOSPITAL_COMMUNITY)
Admission: EM | Admit: 2020-12-10 | Discharge: 2020-12-10 | Disposition: A | Discharge status: Home / Self Care | Payer: BC Managed Care – PPO | Source: Other Acute Inpatient Hospital

## 2020-12-10 ENCOUNTER — Emergency Department (HOSPITAL_COMMUNITY): Payer: Self-pay | Admitting: Sports Medicine

## 2020-12-10 DIAGNOSIS — J181 Lobar pneumonia, unspecified organism: Secondary | ICD-10-CM

## 2020-12-10 DIAGNOSIS — R0602 Shortness of breath: Secondary | ICD-10-CM

## 2020-12-10 DIAGNOSIS — J40 Bronchitis, not specified as acute or chronic: Secondary | ICD-10-CM

## 2020-12-20 ENCOUNTER — Inpatient Hospital Stay (HOSPITAL_COMMUNITY)
Admission: EM | Admit: 2020-12-20 | Discharge: 2020-12-20 | Disposition: A | Discharge status: Home / Self Care | Payer: BC Managed Care – PPO | Source: Other Acute Inpatient Hospital

## 2020-12-20 DIAGNOSIS — Z20822 Contact with and (suspected) exposure to covid-19: Secondary | ICD-10-CM

## 2020-12-20 DIAGNOSIS — G40901 Epilepsy, unspecified, not intractable, with status epilepticus: Secondary | ICD-10-CM

## 2020-12-21 ENCOUNTER — Inpatient Hospital Stay (HOSPITAL_COMMUNITY)
Admission: EM | Admit: 2020-12-21 | Discharge: 2020-12-21 | Disposition: A | Discharge status: Home / Self Care | Payer: BC Managed Care – PPO | Source: Other Acute Inpatient Hospital

## 2020-12-21 DIAGNOSIS — G40901 Epilepsy, unspecified, not intractable, with status epilepticus: Secondary | ICD-10-CM

## 2020-12-21 DIAGNOSIS — D72829 Elevated white blood cell count, unspecified: Secondary | ICD-10-CM

## 2020-12-21 DIAGNOSIS — E876 Hypokalemia: Secondary | ICD-10-CM

## 2020-12-21 DIAGNOSIS — F17210 Nicotine dependence, cigarettes, uncomplicated: Secondary | ICD-10-CM

## 2021-11-10 IMAGING — RF DG HYSTEROGRAM
5 series · 5 of 5 positions shown · non-contrast
Comparison: Ultrasound report 01/31/2018.

CLINICAL DATA: Infertility.

EXAM:
HYSTEROSALPINGOGRAM
TECHNIQUE: Following hysterosalpingography by the patient's physician
fluoroscopic spot images obtained.

[Series 1: fluoro_hsg_singleshot_bw · 0.17mm/px · 1 of 1 slices shown (1 of 5)]
[im 1/1]
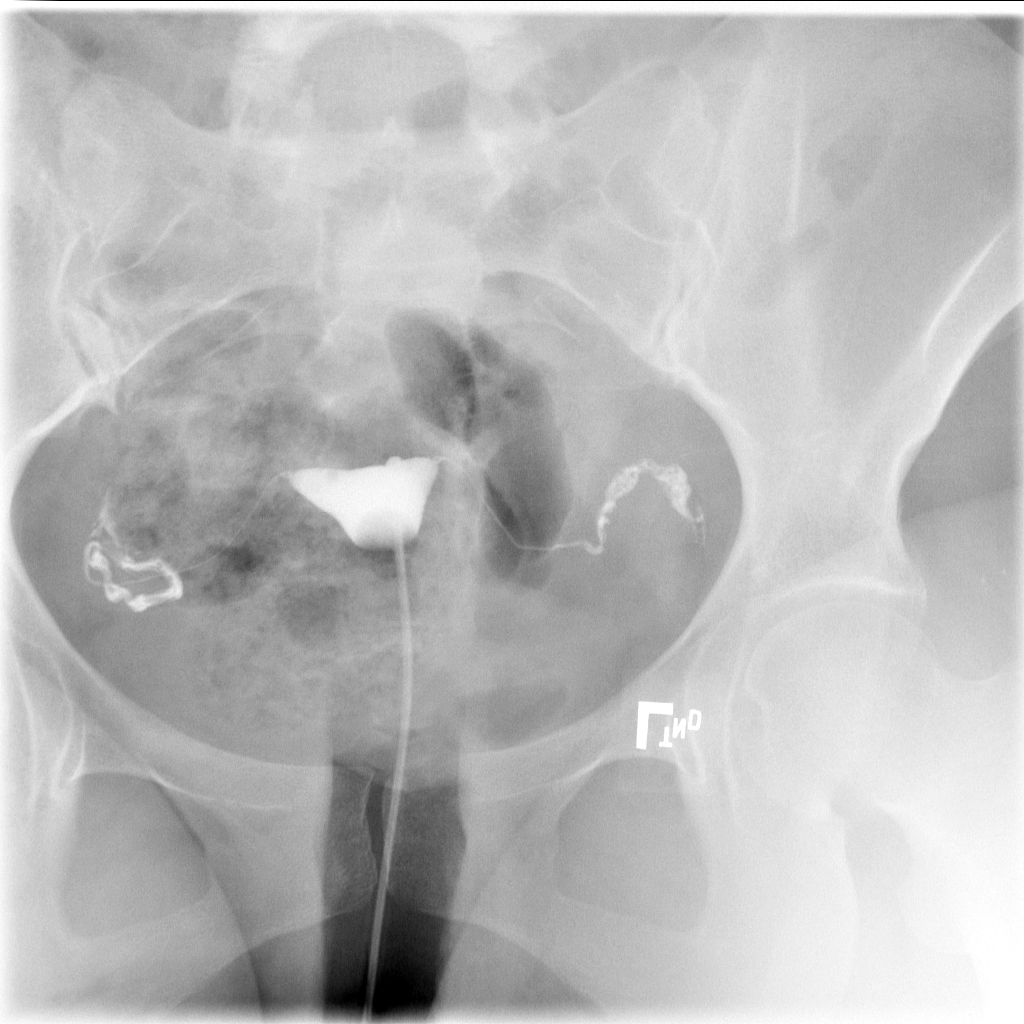

[Series 2: fluoro_hsg_singleshot_bw · 0.17mm/px · 1 of 1 slices shown (2 of 5)]
[im 1/1]
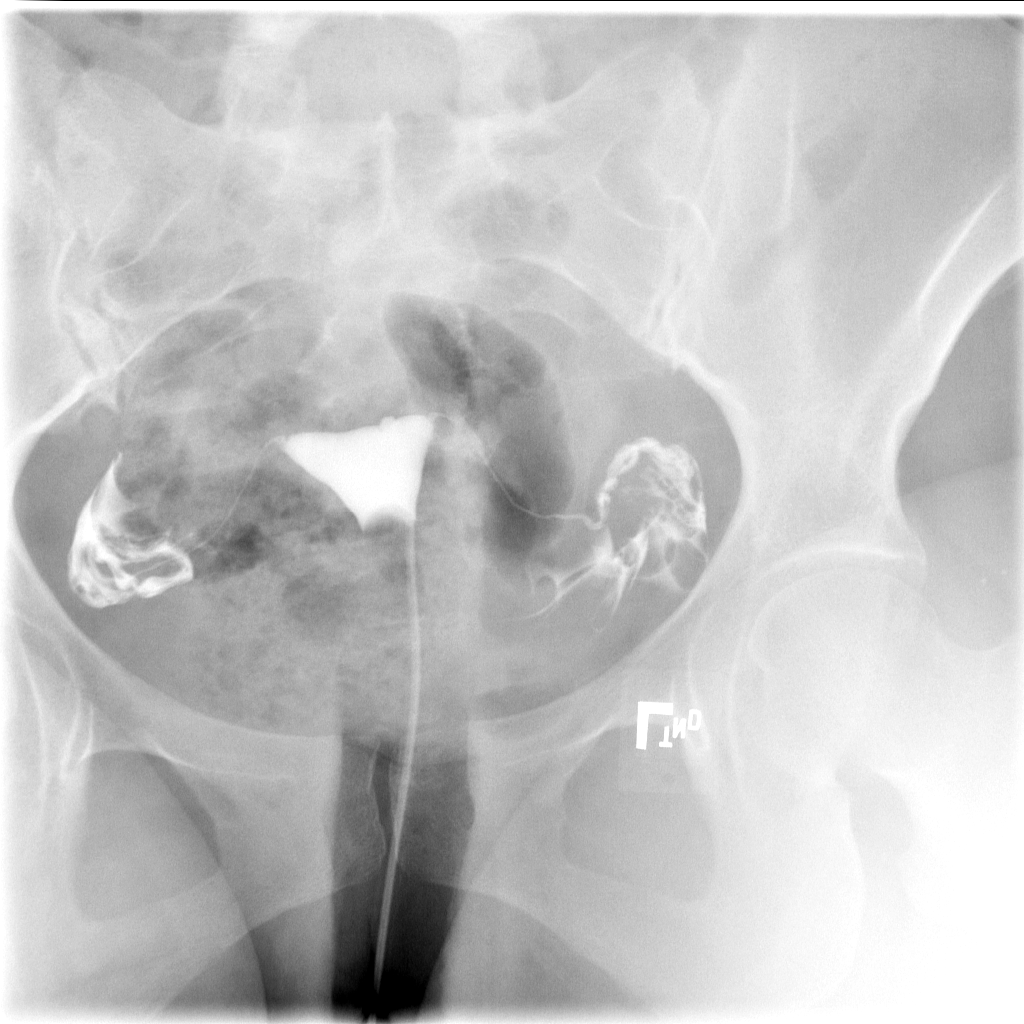

[Series 3: fluoro_hsg_singleshot_bw · 0.17mm/px · 1 of 1 slices shown (3 of 5)]
[im 1/1]
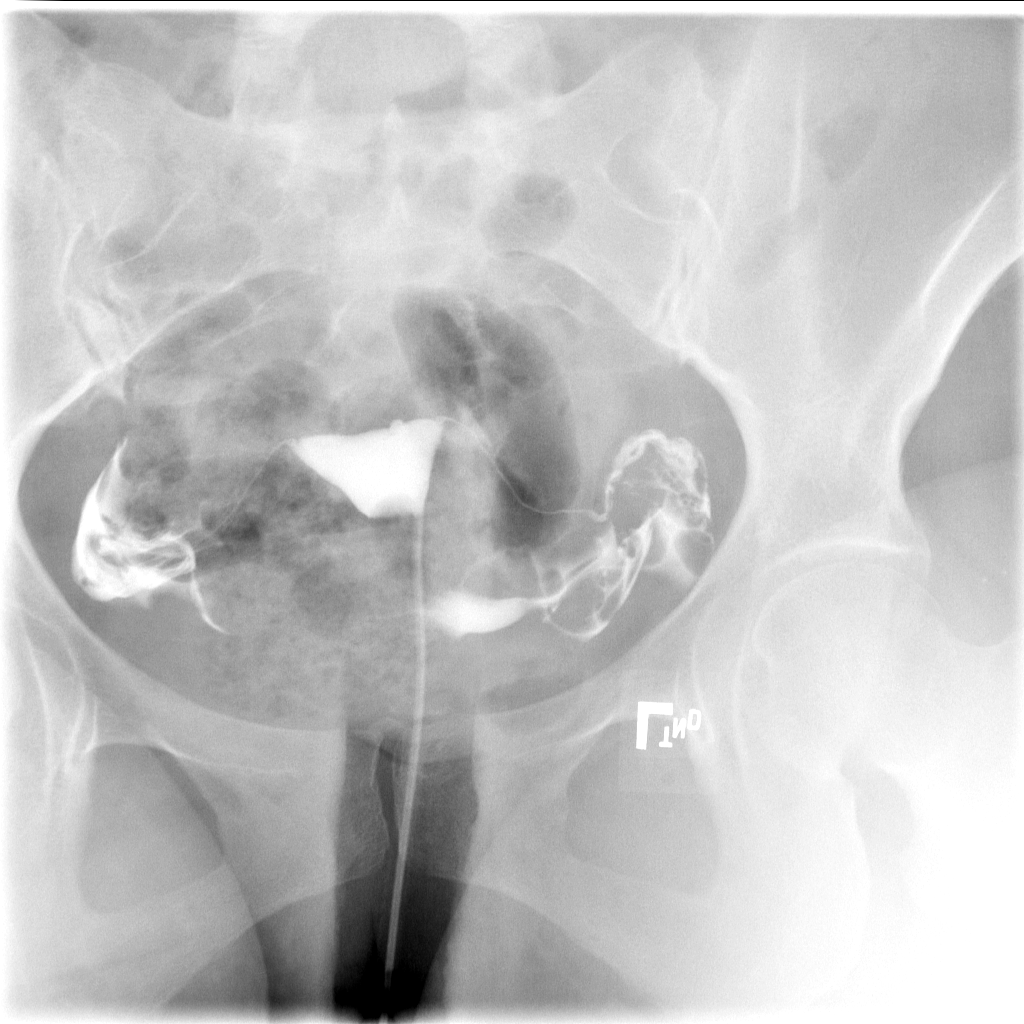

[Series 4: fluoro_hsg_singleshot_bw · 0.17mm/px · 1 of 1 slices shown (4 of 5)]
[im 1/1]
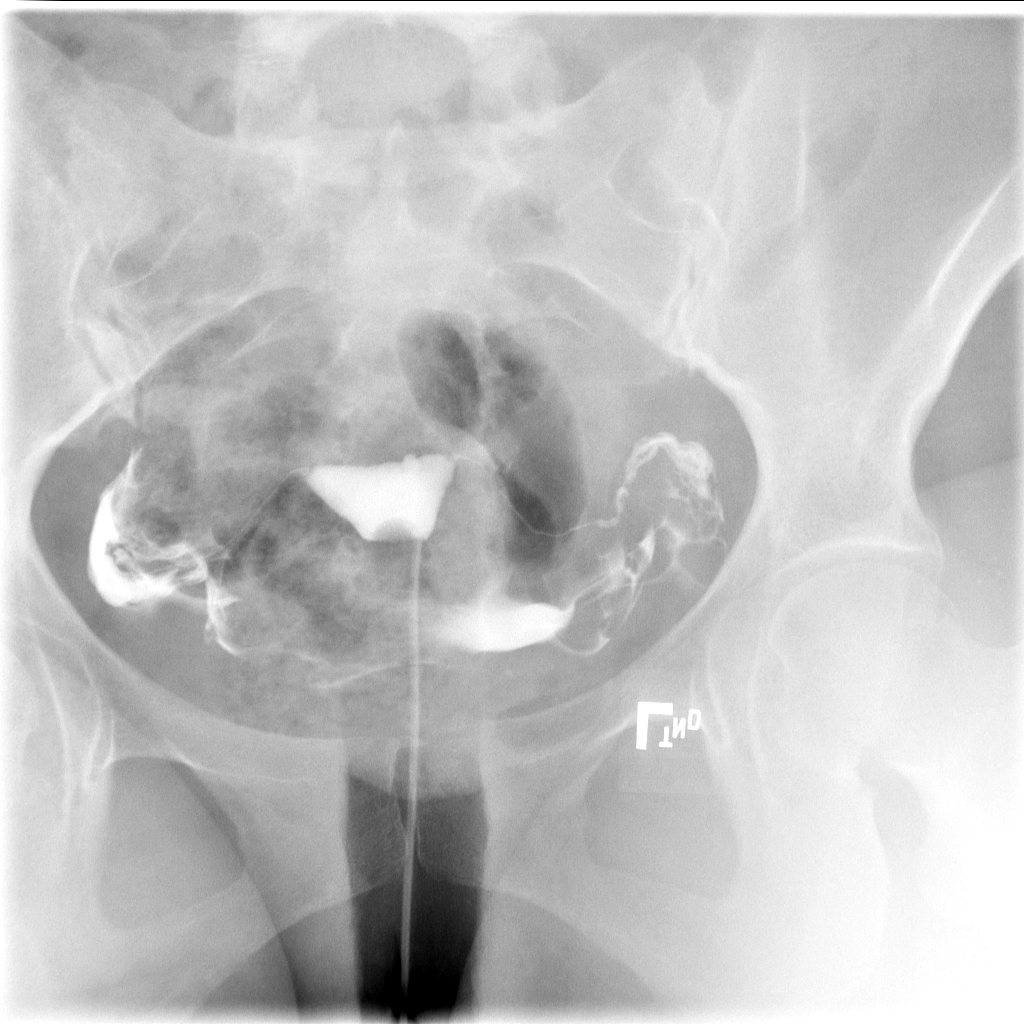

[Series 5: fluoro_hsg_singleshot_bw · 0.17mm/px · 1 of 1 slices shown (5 of 5)]
[im 1/1]
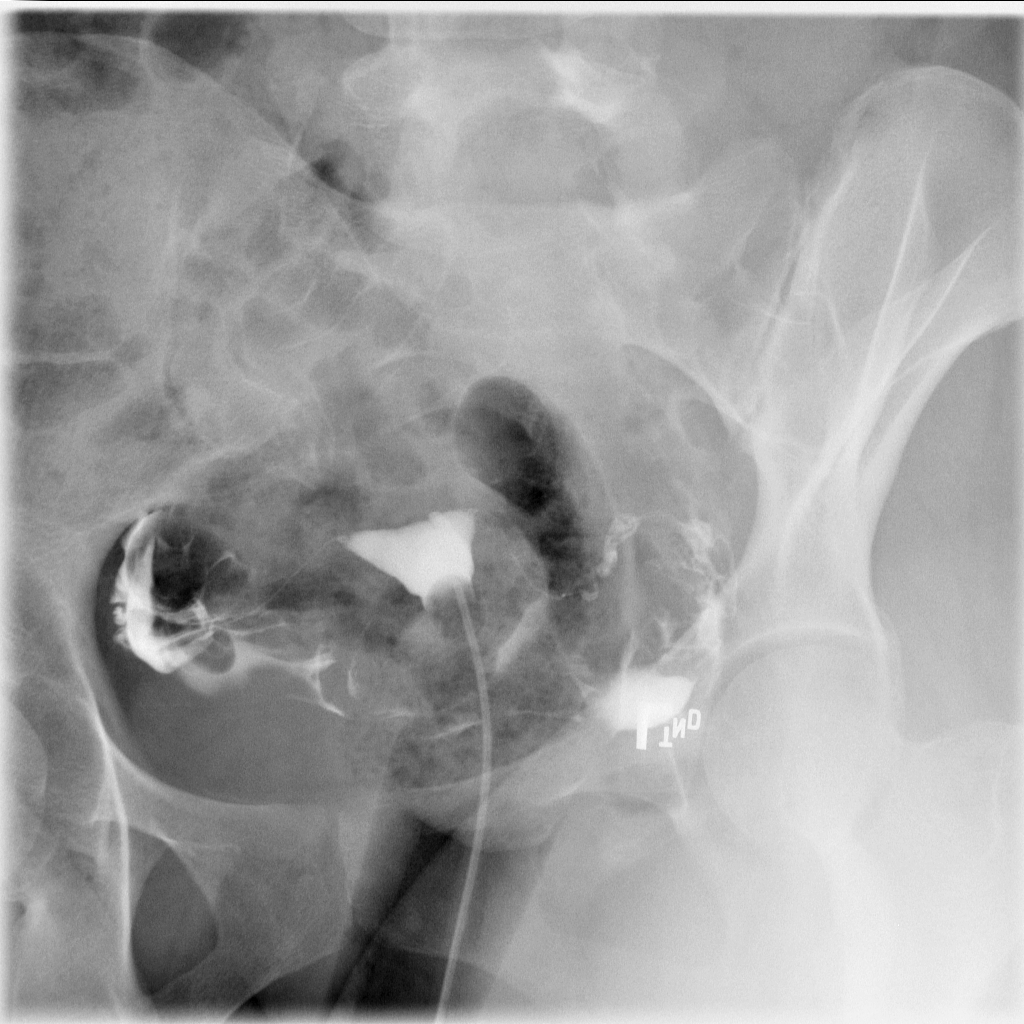

[5 of 5 positions shown; findings below may reference images not displayed]

FLUOROSCOPY TIME:  Radiation Exposure Index (as provided by the
fluoroscopic device): 0 minutes 30 seconds.

If the device does not provide the exposure index:

Fluoroscopy Time:  15.7 mGy
FINDINGS: Bilateral tubal patency noted. Rapid spill was noted. No focal
significant of abnormality identified.
IMPRESSION: Negative exam.  Both fallopian tubes are patent.

## 2021-12-24 ENCOUNTER — Encounter (INDEPENDENT_AMBULATORY_CARE_PROVIDER_SITE_OTHER): Payer: Self-pay | Admitting: GENERAL

## 2021-12-25 ENCOUNTER — Encounter (INDEPENDENT_AMBULATORY_CARE_PROVIDER_SITE_OTHER): Payer: Self-pay

## 2021-12-30 ENCOUNTER — Ambulatory Visit (INDEPENDENT_AMBULATORY_CARE_PROVIDER_SITE_OTHER): Payer: Self-pay | Admitting: GENERAL

## 2022-02-04 ENCOUNTER — Ambulatory Visit: Payer: BC Managed Care – PPO | Attending: Family Medicine | Admitting: GENERAL

## 2022-02-04 ENCOUNTER — Other Ambulatory Visit: Payer: Self-pay

## 2022-02-04 ENCOUNTER — Encounter (INDEPENDENT_AMBULATORY_CARE_PROVIDER_SITE_OTHER): Payer: Self-pay | Admitting: GENERAL

## 2022-02-04 VITALS — BP 128/84 | HR 72 | Resp 16 | Ht 62.0 in | Wt 167.0 lb

## 2022-02-04 DIAGNOSIS — Z0000 Encounter for general adult medical examination without abnormal findings: Secondary | ICD-10-CM

## 2022-02-04 DIAGNOSIS — Z789 Other specified health status: Secondary | ICD-10-CM

## 2022-02-04 DIAGNOSIS — Z0289 Encounter for other administrative examinations: Secondary | ICD-10-CM

## 2022-02-04 DIAGNOSIS — R221 Localized swelling, mass and lump, neck: Secondary | ICD-10-CM

## 2022-02-04 DIAGNOSIS — R569 Unspecified convulsions: Secondary | ICD-10-CM

## 2022-02-04 DIAGNOSIS — R5383 Other fatigue: Secondary | ICD-10-CM

## 2022-02-04 MED ORDER — TOPIRAMATE 100 MG TABLET
100.0000 mg | ORAL_TABLET | Freq: Two times a day (BID) | ORAL | 0 refills | Status: DC
Start: 2022-02-04 — End: 2022-02-25

## 2022-02-04 MED ORDER — LEVETIRACETAM 500 MG TABLET
500.0000 mg | ORAL_TABLET | Freq: Two times a day (BID) | ORAL | 0 refills | Status: DC
Start: 2022-02-04 — End: 2022-02-25

## 2022-02-04 NOTE — Progress Notes (Signed)
[facility]    CLINIC NOTE    PATIENT NAME: Mia Hull  MRN: [id number]  DOB: 1981-02-24  Date of Service: 02/04/2022    SUBJECTIVE:    HPI:  Mia Hull is a 41 y.o. female who presents for an establishing visit.   Chief Complaint   Patient presents with    New Patient     States that she has no energy - has a palpable lump in her throat - would like to try to conceive    Hx of Hodgkins Lymphoma s/p chemo 14 years ago, Chiari malformation with decompression 4 years ago,  hypothyroidism, siezure disorder after head injury as a child.   Last siezure was 1.5 months ago however prior to that was 8 months. They occur usually when triggered with with lack of sleep, or stress.   States she has a lump bilaterally on her neck, worse on the right side since for a few years.   She is trying to get pregnant currently. She is also constantly fatigued however she is only getting 4 hours of sleep a night as she cares for her mother who has dementia.   Smokes 2 cigarettes a day since she was 41yo. Denies CP, SOB, fevers, chills, N/V, constipation.   Currently has diarrhea. Believes it is a gastroenteritis passed from her husband who is also sick with diarrhea.        Review of Systems:   Review of Systems   Constitutional: Negative for chills and fever.   HENT: Negative for congestion.    Eyes: Negative for pain.   Respiratory: Negative for cough, chest tightness and shortness of breath.    Cardiovascular: Negative for chest pain, palpitations and leg swelling.   Gastrointestinal: Positive for diarrhea. Negative for abdominal pain, blood in stool, constipation, nausea and vomiting.   Genitourinary: Negative for dysuria, frequency and hematuria.   Musculoskeletal: Negative.    Skin: Negative for color change.   Neurological: Negative for dizziness, light-headedness and headaches.   Psychiatric/Behavioral: Negative for agitation.       MEDICAL HISTORY      Problem List  Patient Active  Problem List    Diagnosis Date Noted    Seizure disorder (CMS [facility]) 08/17/2019    Syringomyelia (CMS [place]) 10/09/2015    Hodgkin's disease (CMS [place]) 08/11/2013     Past Medical History  Past Medical History:   Diagnosis Date    Brain aneurysm     GERD (gastroesophageal reflux disease)     History of Hodgkin's lymphoma     Seizure disorder (CMS [facility])          Past Surgical History  Past Surgical History:   Procedure Laterality Date    HX INTRACRANIAL ANEURYSM REPAIR         Medications  Current Outpatient Medications   Medication Sig    levETIRAcetam (KEPPRA) 500 mg Oral Tablet Take 1 Tablet (500 mg total) by mouth Twice daily for 30 days    topiramate (TOPAMAX) 100 mg Oral Tablet Take 1 Tablet (100 mg total) by mouth Twice daily for 30 days     Allergies  Allergies   Allergen Reactions    Ativan [Lorazepam] Anaphylaxis    Diphenhydramine Hives/ Urticaria and Swelling    Fentanyl Anaphylaxis       Family History  Family Medical History:       Problem Relation (Age of Onset)    Cancer Mother  Dementia Mother            Social History    Social History     Social History Narrative    Not on file     OBJECTIVE     Temp:    Pulse: 72  Resp: 16  SpO2:   Weight: Weight: 75.8 kg (167 lb)  BP 128/84 (Site: Left, Patient Position: Sitting, Cuff Size: Adult)   Pulse 72   Resp 16   Ht 1.575 m ('5\' 2"'$ )   Wt 75.8 kg (167 lb)   LMP 01/19/2022 (Exact Date)   BMI 30.54 kg/m         Physical Exam  Vitals reviewed.   Constitutional:       General: She is not in acute distress.  HENT:      Head: Normocephalic and atraumatic.      Right Ear: Tympanic membrane, ear canal and external ear normal. There is no impacted cerumen.      Left Ear: Tympanic membrane, ear canal and external ear normal. There is no impacted cerumen.      Nose: Nose normal. No congestion or rhinorrhea.      Mouth/Throat:      Mouth: Mucous membranes are moist.      Pharynx: Oropharynx is clear. No oropharyngeal exudate or posterior oropharyngeal erythema.    Eyes:      General: No scleral icterus.     Extraocular Movements: Extraocular movements intact.      Pupils: Pupils are equal, round, and reactive to light.   Neck:      Comments: Small lump on left side of neck, non tender, smooth  Cardiovascular:      Rate and Rhythm: Normal rate and regular rhythm.      Pulses: Normal pulses.      Heart sounds: Normal heart sounds. No murmur heard.    No friction rub. No gallop.   Pulmonary:      Effort: Pulmonary effort is normal.      Breath sounds: Normal breath sounds. No wheezing, rhonchi or rales.   Abdominal:      General: Bowel sounds are normal.      Palpations: Abdomen is soft.      Tenderness: There is no abdominal tenderness.   Musculoskeletal:      Right lower leg: No edema.      Left lower leg: No edema.   Lymphadenopathy:      Cervical: No cervical adenopathy.   Skin:     General: Skin is warm and dry.      Capillary Refill: Capillary refill takes less than 2 seconds.      Coloration: Skin is not jaundiced.   Neurological:      General: No focal deficit present.      Mental Status: She is alert and oriented to person, place, and time.      Motor: No weakness.   Psychiatric:         Mood and Affect: Mood normal.         ASSESSMENT AND PLAN       ENCOUNTER DIAGNOSES     ICD-10-CM   1. Preventative health care  Z00.00   2. Lump in neck  R22.1   3. Attempting to conceive  Z78.9   4. Seizures (CMS HCC)  R56.9   5. Fatigue, unspecified type  R53.83     -neck ultrasound  -labwork  -referral to oby/gyn  -refilled medications for siezures    Follow  up 3 months    Genelle Bal, MD   02/04/2022, 13:10   [facility]        While the patient was present in the clinic, I discussed their case with the resident.  I reviewed the resident's note.  I agree with the findings and plan of care as documented in the resident's note.  Any exceptions/additions are edited/noted.    Johann Capers, MD

## 2022-02-06 ENCOUNTER — Other Ambulatory Visit (HOSPITAL_COMMUNITY): Payer: BC Managed Care – PPO

## 2022-02-06 ENCOUNTER — Inpatient Hospital Stay
Admission: RE | Admit: 2022-02-06 | Discharge: 2022-02-06 | Disposition: A | Discharge status: Home / Self Care | Payer: BC Managed Care – PPO | Source: Ambulatory Visit | Attending: Family Medicine | Admitting: Family Medicine

## 2022-02-06 ENCOUNTER — Other Ambulatory Visit: Payer: Self-pay

## 2022-02-06 DIAGNOSIS — R221 Localized swelling, mass and lump, neck: Secondary | ICD-10-CM | POA: Insufficient documentation

## 2022-02-06 DIAGNOSIS — Z0000 Encounter for general adult medical examination without abnormal findings: Secondary | ICD-10-CM | POA: Insufficient documentation

## 2022-02-06 LAB — URINALYSIS, MACRO/MICRO
BILIRUBIN: NEGATIVE mg/dL
BLOOD: NEGATIVE mg/dL
GLUCOSE: NEGATIVE mg/dL
KETONES: NEGATIVE mg/dL
LEUKOCYTES: NEGATIVE WBCs/uL
NITRITE: NEGATIVE
PH: 6.5 (ref 5.0–9.0)
PROTEIN: NEGATIVE mg/dL
SPECIFIC GRAVITY: 1.013 (ref 1.003–1.035)
UROBILINOGEN: <2 mg/dL (ref <2.0)

## 2022-02-06 LAB — COMPREHENSIVE METABOLIC PNL, FASTING
ALBUMIN/GLOBULIN RATIO: 1.4 — ABNORMAL LOW (ref 1.5–2.5)
ALBUMIN: 4.6 g/dL (ref 3.5–5.0)
ALKALINE PHOSPHATASE: 69 U/L (ref 38–126)
ALT (SGPT): 16 U/L (ref <35)
ANION GAP: 17 mmol/L (ref 5–19)
AST (SGOT): 27 U/L (ref 14–36)
BILIRUBIN TOTAL: 0.4 mg/dL (ref 0.2–1.3)
BUN/CREA RATIO: 14 (ref 6–20)
BUN: 12 mg/dL (ref 7–17)
CALCIUM: 9.3 mg/dL (ref 8.4–10.2)
CHLORIDE: 111 mmol/L — ABNORMAL HIGH (ref 98–107)
CO2 TOTAL: 14 mmol/L — ABNORMAL LOW (ref 22–30)
CREATININE: 0.85 mg/dL (ref 0.52–1.00)
ESTIMATED GFR: >60 mL/min/{1.73_m2} (ref >60)
GLUCOSE: 77 mg/dL (ref 74–106)
POTASSIUM: 4.4 mmol/L (ref 3.5–5.1)
PROTEIN TOTAL: 7.8 g/dL (ref 6.3–8.2)
SODIUM: 142 mmol/L (ref 137–145)

## 2022-02-06 LAB — CBC WITH DIFF
BASOPHIL #: 0.1 10*3/uL (ref 0.00–0.20)
BASOPHIL %: 1 % (ref 0–2)
EOSINOPHIL #: 0.1 10*3/uL (ref 0.00–0.60)
EOSINOPHIL %: 1 % (ref 0–5)
HCT: 44.5 % (ref 36.0–48.0)
HGB: 15.3 g/dL — ABNORMAL HIGH (ref 11.6–14.8)
LYMPHOCYTE #: 2.7 10*3/uL (ref 1.10–3.80)
LYMPHOCYTE %: 34 % (ref 19–46)
MCH: 33.7 pg (ref 24.4–34.0)
MCHC: 34.3 g/dL (ref 30.0–37.0)
MCV: 98.3 fL — ABNORMAL HIGH (ref 79.0–88.0)
MONOCYTE #: 0.5 10*3/uL (ref 0.10–0.80)
MONOCYTE %: 6 % (ref 4–12)
MPV: 8 fL (ref 7.5–11.5)
NEUTROPHIL #: 4.5 10*3/uL (ref 1.80–7.50)
NEUTROPHIL %: 58 % (ref 41–69)
PLATELETS: 184 10*3/uL (ref 130–400)
RBC: 4.52 10*6/uL (ref 3.50–5.50)
RDW: 13.8 % (ref 11.5–14.0)
WBC: 7.9 10*3/uL (ref 4.5–11.5)

## 2022-02-06 LAB — THYROID STIMULATING HORMONE (SENSITIVE TSH): TSH: 1.86 u[IU]/mL (ref 0.465–4.680)

## 2022-02-06 LAB — HGA1C (HEMOGLOBIN A1C WITH EST AVG GLUCOSE)
ESTIMATED AVERAGE GLUCOSE: 91 mg/dL
HEMOGLOBIN A1C: 4.8 % (ref 4.0–6.0)

## 2022-02-06 LAB — URINE HOLD

## 2022-02-06 LAB — VITAMIN D 25 TOTAL: VITAMIN D: 27 ng/mL — ABNORMAL LOW (ref 30–100)

## 2022-02-06 LAB — MICRO HOLD

## 2022-02-09 ENCOUNTER — Other Ambulatory Visit: Payer: Self-pay

## 2022-02-16 ENCOUNTER — Telehealth (INDEPENDENT_AMBULATORY_CARE_PROVIDER_SITE_OTHER): Payer: Self-pay | Admitting: GENERAL

## 2022-02-16 NOTE — Telephone Encounter (Signed)
Patient would like a call to discuss her lab and unltrasound results

## 2022-02-18 ENCOUNTER — Other Ambulatory Visit: Payer: Self-pay

## 2022-02-18 ENCOUNTER — Encounter (INDEPENDENT_AMBULATORY_CARE_PROVIDER_SITE_OTHER): Payer: Self-pay | Admitting: Obstetrics & Gynecology

## 2022-02-18 ENCOUNTER — Other Ambulatory Visit (HOSPITAL_COMMUNITY): Payer: BC Managed Care – PPO | Admitting: Obstetrics & Gynecology

## 2022-02-18 ENCOUNTER — Ambulatory Visit (INDEPENDENT_AMBULATORY_CARE_PROVIDER_SITE_OTHER): Payer: BC Managed Care – PPO | Admitting: GENERAL

## 2022-02-18 ENCOUNTER — Other Ambulatory Visit (INDEPENDENT_AMBULATORY_CARE_PROVIDER_SITE_OTHER): Payer: Self-pay

## 2022-02-18 ENCOUNTER — Ambulatory Visit: Payer: BC Managed Care – PPO | Attending: Obstetrics & Gynecology | Admitting: Obstetrics & Gynecology

## 2022-02-18 VITALS — BP 104/62 | Ht 60.0 in | Wt 168.0 lb

## 2022-02-18 DIAGNOSIS — Z789 Other specified health status: Secondary | ICD-10-CM | POA: Insufficient documentation

## 2022-02-18 DIAGNOSIS — N921 Excessive and frequent menstruation with irregular cycle: Secondary | ICD-10-CM | POA: Insufficient documentation

## 2022-02-18 DIAGNOSIS — Z1239 Encounter for other screening for malignant neoplasm of breast: Secondary | ICD-10-CM

## 2022-02-18 DIAGNOSIS — Z124 Encounter for screening for malignant neoplasm of cervix: Secondary | ICD-10-CM | POA: Insufficient documentation

## 2022-02-18 MED ORDER — FOLIC ACID 1 MG TABLET
ORAL_TABLET | ORAL | 11 refills | Status: DC
Start: 2022-02-18 — End: 2022-02-18

## 2022-02-18 MED ORDER — MEDROXYPROGESTERONE 10 MG TABLET
10.0000 mg | ORAL_TABLET | Freq: Every day | ORAL | 0 refills | Status: DC
Start: 2022-02-18 — End: 2022-02-18

## 2022-02-18 MED ORDER — CLOMIPHENE CITRATE 50 MG TABLET
50.0000 mg | ORAL_TABLET | Freq: Every day | ORAL | 5 refills | Status: AC
Start: 2022-02-18 — End: 2022-03-20

## 2022-02-18 MED ORDER — MEDROXYPROGESTERONE 10 MG TABLET
10.0000 mg | ORAL_TABLET | Freq: Every day | ORAL | 0 refills | Status: AC
Start: 2022-02-18 — End: 2022-02-28

## 2022-02-18 MED ORDER — FOLIC ACID 1 MG TABLET
ORAL_TABLET | ORAL | 11 refills | Status: AC
Start: 2022-02-18 — End: ?

## 2022-02-18 MED ORDER — CLOMIPHENE CITRATE 50 MG TABLET
50.0000 mg | ORAL_TABLET | Freq: Every day | ORAL | 5 refills | Status: DC
Start: 2022-02-18 — End: 2022-02-18

## 2022-02-18 NOTE — Progress Notes (Signed)
OB/GYN, [facility] [address]  [place] [address]  Operated by [facility]     Name: Latangela Mccomas MRN:  [id number]   Date: 02/18/2022 Age: 41 y.o.      Annual exam    Reason for Visit: Preconception (Also notes irregular periods. Has had semen analysis/labs/HSG in [place]. States everything was normal. New patient. )       HPI:  irregular periods, no sexual complaints, no change in vag DC, no breast symptoms, no abdominal or pelvic pain, no urinary symptoms    Past Medical History:   Diagnosis Date   . Brain aneurysm    . GERD (gastroesophageal reflux disease)    . History of Hodgkin's lymphoma    . Seizure disorder (CMS [facility])           Past Surgical History:   Procedure Laterality Date   . CESAREAN SECTION     . HX INTRACRANIAL ANEURYSM REPAIR            Allergies   Allergen Reactions   . Ativan [Lorazepam] Anaphylaxis   . Diphenhydramine Hives/ Urticaria and Swelling   . Fentanyl Anaphylaxis      Family Medical History:     Problem Relation (Age of Onset)    Cancer Mother    Dementia Mother             ROS:  no fevers or chill, no productive cough or sob, no chest pain, no nausea/vomiting, or change in bowel habits    PE:       Vitals:    02/18/22 1039   BP: 104/62   Weight: 76.2 kg (168 lb)   Height: 1.524 m (5')   BMI: 32.88             Chest ctab, no wheezing,    Heart RRR no additional sounds heard   Abd soft non-tender no rebound or guarding   Pelvic exam  normal appearing external genitalia, normal appearing vaginal DC, normal size mobile uterus with good support.  Good vaginal support   Breast exam  normal fibrocystic breasts, no fixed masses or skin changes      A:  (Z12.4) Screening for cervical cancer  (primary encounter diagnosis)  Plan: CYTOPATHOLOGY, GYN +/- HIGH RISK HPV    (Z78.9) Attempting to conceive    (Z12.39) Encounter for breast cancer screening using non-mammogram modality    (N92.1) Metrorrhagia       P:  pap test performed,  mammograms after 40, fu in 1 yr or sooner  if having any problem    Discussed use of clomid or letrozole to regulate periods.  Pt understands there is also and increased risk of pregnancy and multiple gestations while taking this medicine.  We are using it to try and regulate her periods.      Orders Placed This Encounter   . Clomiphene Citrate (CLOMID) 50 mg Oral Tablet   . medroxyPROGESTERone (PROVERA) 10 mg Oral Tablet   . folic acid (FOLVITE) 1 mg Oral Tablet   . CYTOPATHOLOGY, GYN +/- HIGH RISK HPV        No follow-ups on file.     Marcie Bal, MD

## 2022-02-19 ENCOUNTER — Other Ambulatory Visit (INDEPENDENT_AMBULATORY_CARE_PROVIDER_SITE_OTHER): Payer: Self-pay | Admitting: GENERAL

## 2022-02-19 DIAGNOSIS — L989 Disorder of the skin and subcutaneous tissue, unspecified: Secondary | ICD-10-CM

## 2022-02-19 MED ORDER — CHOLECALCIFEROL (VITAMIN D3) 125 MCG (5,000 UNIT) CAPSULE
5000.0000 [IU] | ORAL_CAPSULE | Freq: Every day | ORAL | Status: DC
Start: 2022-02-19 — End: 2022-02-25

## 2022-02-19 MED ORDER — CHOLECALCIFEROL (VITAMIN D3) 1,250 MCG (50,000 UNIT) CAPSULE
50000.0000 [IU] | ORAL_CAPSULE | ORAL | 0 refills | Status: AC
Start: 2022-02-19 — End: 2022-04-10

## 2022-02-19 NOTE — Result Encounter Note (Cosign Needed Addendum)
Called patient and spoke with her concerning results. I am ordering a CT scan of her neck for the neck lesion that was visualized on ultrasound. I am sending in Vit D for patient.

## 2022-02-20 ENCOUNTER — Encounter (INDEPENDENT_AMBULATORY_CARE_PROVIDER_SITE_OTHER): Payer: Self-pay | Admitting: Obstetrics & Gynecology

## 2022-02-20 LAB — CYTOPATHOLOGY, GYN +/- HIGH RISK HPV

## 2022-02-22 LAB — HUMAN PAPILLOMA VIRUS (HPV) BY PCR WITH HIGH RISK GENOTYPING (THINPREP)
HPV OTHER: NEGATIVE
HPV16 PCR: NEGATIVE
HPV18 PCR: NEGATIVE

## 2022-02-25 ENCOUNTER — Other Ambulatory Visit (INDEPENDENT_AMBULATORY_CARE_PROVIDER_SITE_OTHER): Payer: Self-pay | Admitting: GENERAL

## 2022-02-26 MED ORDER — TOPIRAMATE 100 MG TABLET
100.0000 mg | ORAL_TABLET | Freq: Two times a day (BID) | ORAL | 0 refills | Status: AC
Start: 2022-02-26 — End: 2022-03-28

## 2022-02-26 MED ORDER — LEVETIRACETAM 500 MG TABLET
500.0000 mg | ORAL_TABLET | Freq: Two times a day (BID) | ORAL | 0 refills | Status: AC
Start: 2022-02-26 — End: 2022-03-28

## 2022-02-26 MED ORDER — CHOLECALCIFEROL (VITAMIN D3) 125 MCG (5,000 UNIT) CAPSULE
5000.0000 [IU] | ORAL_CAPSULE | Freq: Every day | ORAL | 0 refills | Status: AC
Start: 2022-02-26 — End: 2022-05-27

## 2022-03-16 ENCOUNTER — Encounter (INDEPENDENT_AMBULATORY_CARE_PROVIDER_SITE_OTHER): Payer: Self-pay | Admitting: GENERAL

## 2022-03-19 ENCOUNTER — Telehealth (INDEPENDENT_AMBULATORY_CARE_PROVIDER_SITE_OTHER): Payer: Self-pay | Admitting: GENERAL

## 2022-03-20 ENCOUNTER — Other Ambulatory Visit: Payer: Self-pay

## 2022-03-20 ENCOUNTER — Ambulatory Visit: Payer: BC Managed Care – PPO | Attending: Internal Medicine | Admitting: GENERAL

## 2022-03-20 ENCOUNTER — Other Ambulatory Visit (HOSPITAL_COMMUNITY): Payer: Self-pay | Admitting: GENERAL

## 2022-03-20 ENCOUNTER — Encounter (INDEPENDENT_AMBULATORY_CARE_PROVIDER_SITE_OTHER): Payer: Self-pay | Admitting: GENERAL

## 2022-03-20 VITALS — BP 118/76 | HR 68 | Resp 14 | Ht 60.0 in | Wt 161.8 lb

## 2022-03-20 DIAGNOSIS — R102 Pelvic and perineal pain: Secondary | ICD-10-CM | POA: Insufficient documentation

## 2022-03-20 DIAGNOSIS — R300 Dysuria: Secondary | ICD-10-CM | POA: Insufficient documentation

## 2022-03-20 DIAGNOSIS — L989 Disorder of the skin and subcutaneous tissue, unspecified: Secondary | ICD-10-CM

## 2022-03-20 LAB — URINALYSIS, MACRO/MICRO
BILIRUBIN: NEGATIVE mg/dL
GLUCOSE: NEGATIVE mg/dL
KETONES: NEGATIVE mg/dL
NITRITE: NEGATIVE
PH: 6.5 (ref 5.0–9.0)
PROTEIN: NEGATIVE mg/dL
SPECIFIC GRAVITY: 1.006 (ref 1.003–1.035)
UROBILINOGEN: <2 mg/dL (ref <2.0)

## 2022-03-20 LAB — MICRO HOLD

## 2022-03-20 MED ORDER — PHENAZOPYRIDINE 100 MG TABLET
100.0000 mg | ORAL_TABLET | Freq: Three times a day (TID) | ORAL | 0 refills | Status: AC
Start: 2022-03-20 — End: 2022-03-22

## 2022-03-20 MED ORDER — NITROFURANTOIN MONOHYDRATE/MACROCRYSTALS 100 MG CAPSULE
100.0000 mg | ORAL_CAPSULE | Freq: Two times a day (BID) | ORAL | 0 refills | Status: AC
Start: 2022-03-20 — End: 2022-03-25

## 2022-03-20 NOTE — Progress Notes (Signed)
[place]    ACUTE CARE VISIT    PATIENT NAME: Mia Hull  MRN: [id number]  DOB: Feb 16, 1981  Date of Service: 03/20/2022    SUBJECTIVE:      Mia Hull is a 41 y.o. female who presents for   Chief Complaint   Patient presents with    Urinary Tract Infection     Reports urinary symptoms for 3-4 days.  States she has had blood in urine starting today. States the hematuria changed the color of the water in toilet bowl.  Reports burning and frequency for the past 3 days.  Currently taking fertility medication.       3-4 days ago patient began to experience dysuria. This morning she noticed bright red blood in her urine with sediment on the top of the bowl.  She is also experiencing urinary frequency and urgency. She has had an incontinence episode while attempting to get to the toilet. Denies pruritis, malodorous or a change in vaginal discharge. She increased her water intake and increased frequency of baths with epsom salt without relief. She is sexually active with one female partner.   Patient is on fertility treatment with Clomid. First day of LMP 03/02/22. She has irregular menstrual cycles due to treatment.  Patient has history of recurrent UTI while receiving chemotherapy and during last pregnancy approximately 20 years ago. No UTIs in the past 1 year.    Review of Systems:   Review of Systems   Constitutional: Positive for fatigue. Negative for chills and fever.   HENT: Negative.    Respiratory: Negative.    Cardiovascular: Negative.    Gastrointestinal: Negative.    Genitourinary: Positive for dysuria, frequency, hematuria and urgency.   Musculoskeletal: Negative.    Neurological: Negative.    Hematological: Negative.        MEDICAL HISTORY      Problem List  Patient Active Problem List    Diagnosis Date Noted    Seizure disorder (CMS [facility]) 08/17/2019    Syringomyelia (CMS [facility]) 10/09/2015    Hodgkin's disease (CMS [facility]) 08/11/2013       Past Medical History  Past  Medical History:   Diagnosis Date    Brain aneurysm     GERD (gastroesophageal reflux disease)     History of Hodgkin's lymphoma     Seizure disorder (CMS [facility])            Past Surgical History  Past Surgical History:   Procedure Laterality Date    CESAREAN SECTION      HX INTRACRANIAL ANEURYSM REPAIR         Medications  Current Outpatient Medications   Medication Sig    cholecalciferol, vitamin D3, 1,250 mcg (50,000 unit) Oral Capsule Take 1 Capsule (50,000 Units total) by mouth Every 7 days for 8 doses    cholecalciferol, vitamin D3, 125 mcg (5,000 unit) Oral Capsule Take 1 Capsule (5,000 Units total) by mouth Once a day for 90 days    Clomiphene Citrate (CLOMID) 50 mg Oral Tablet Take 1 Tablet (50 mg total) by mouth Once a day for 5 days Take from day 3-7 of cycle    folic acid (FOLVITE) 1 mg Oral Tablet Take 4 po q day    levETIRAcetam (KEPPRA) 500 mg Oral Tablet Take 1 Tablet (500 mg total) by mouth Twice daily for 30 days    nitrofurantoin monohyd/m-cryst (MACROBID) 100 mg Oral Capsule Take 1 Capsule (100 mg total)  by mouth Twice daily for 5 days Indications: bacterial urinary tract infection    phenazopyridine (PYRIDIUM) 100 mg Oral Tablet Take 1 Tablet (100 mg total) by mouth Three times a day for 2 days    topiramate (TOPAMAX) 100 mg Oral Tablet Take 1 Tablet (100 mg total) by mouth Twice daily for 30 days       Allergies  Allergies   Allergen Reactions    Ativan [Lorazepam] Anaphylaxis    Diphenhydramine Hives/ Urticaria and Swelling    Fentanyl Anaphylaxis       Family History  Family Medical History:       Problem Relation (Age of Onset)    Cancer Mother    Dementia Mother              Social History  Social History     Tobacco Use    Smoking status: Every Day     Packs/day: 0.20     Types: Cigarettes    Smokeless tobacco: Never   Vaping Use    Vaping Use: Never used   Substance Use Topics    Alcohol use: Never    Drug use: Never       OBJECTIVE     BP 118/76 (Site: Right, Patient Position: Sitting, Cuff  Size: Adult)   Pulse 68   Resp 14   Ht 1.524 m (5')   Wt 73.4 kg (161 lb 12.8 oz)   LMP 03/11/2022 (Approximate)   BMI 31.60 kg/m      Physical Exam  Vitals reviewed.   Constitutional:       Appearance: She is obese.   Cardiovascular:      Rate and Rhythm: Normal rate and regular rhythm.   Pulmonary:      Effort: Pulmonary effort is normal.   Abdominal:      General: Abdomen is flat.      Tenderness: There is abdominal tenderness (pelvic). There is no guarding.   Genitourinary:     Comments: No CV angle tenderness  Musculoskeletal:         General: Normal range of motion.   Skin:     General: Skin is warm.      Capillary Refill: Capillary refill takes less than 2 seconds.   Neurological:      General: No focal deficit present.      Mental Status: She is alert.   Psychiatric:         Mood and Affect: Mood normal.         Behavior: Behavior normal.         Thought Content: Thought content normal.         Judgment: Judgment normal.         ASSESSMENT AND PLAN     41 y.o. female with  [place]   1. Dysuria  R30.0   2. Pelvic pain  R10.2     Havannah was seen today for urinary tract infection.    Diagnoses and all orders for this visit:    Dysuria  -     POCT Urine Dipstick  -     URINALYSIS WITH REFLEX MICROSCOPIC AND CULTURE IF POSITIVE; Future  -     URINALYSIS WITH REFLEX MICROSCOPIC AND CULTURE IF POSITIVE    Pelvic pain  -     HCG, URINE QUALITATIVE, PREGNANCY; Future    Other orders  -     nitrofurantoin monohyd/m-cryst (MACROBID) 100 mg Oral Capsule; Take 1  Capsule (100 mg total) by mouth Twice daily for 5 days Indications: bacterial urinary tract infection  -     phenazopyridine (PYRIDIUM) 100 mg Oral Tablet; Take 1 Tablet (100 mg total) by mouth Three times a day for 2 days       - Urine dipstick performed in office significant for 2+ leukocytes. Will send to lab for reflex culture.  - Declines STI testing at this time  - Macrobid and Pyridium sent to pharmacy.    Mia Fisherman,  MD   03/20/2022, 13:53   Family Medicine Resident, PGY-2  [facility]    I did not personally evaluate the patient but I reviewed the patient's case and plan of care with the resident physician and agree with the documentation above except as otherwise noted.    Gardiner Fanti, MD  03/20/2022, 14:43      This note may have been partially generated using MModal Fluency Direct system, and there may be some incorrect words, spellings, and punctuation that were not noted in checking the note before saving.

## 2022-03-22 ENCOUNTER — Inpatient Hospital Stay
Admission: RE | Admit: 2022-03-22 | Discharge: 2022-03-22 | Disposition: A | Discharge status: Home / Self Care | Payer: BC Managed Care – PPO | Source: Ambulatory Visit | Attending: Internal Medicine | Admitting: Internal Medicine

## 2022-03-22 ENCOUNTER — Other Ambulatory Visit: Payer: Self-pay

## 2022-03-22 ENCOUNTER — Other Ambulatory Visit (HOSPITAL_COMMUNITY): Payer: BC Managed Care – PPO

## 2022-03-22 DIAGNOSIS — R102 Pelvic and perineal pain: Secondary | ICD-10-CM | POA: Insufficient documentation

## 2022-03-22 DIAGNOSIS — L989 Disorder of the skin and subcutaneous tissue, unspecified: Secondary | ICD-10-CM

## 2022-03-22 LAB — URINE CULTURE,ROUTINE: URINE CULTURE: NO GROWTH

## 2022-03-22 LAB — CREATININE WITH EGFR
CREATININE: 0.93 mg/dL (ref 0.52–1.00)
ESTIMATED GFR: >60 mL/min/{1.73_m2} (ref >60)

## 2022-03-22 LAB — HCG, URINE QUALITATIVE, PREGNANCY: HCG URINE QUALITATIVE: NEGATIVE

## 2022-03-22 MED ORDER — IOPAMIDOL 300 MG IODINE/ML (61 %) INTRAVENOUS SOLUTION
75.0000 mL | INTRAVENOUS | Status: AC
Start: 2022-03-22 — End: 2022-03-22
  Administered 2022-03-22: 75 mL via INTRAVENOUS

## 2022-07-29 ENCOUNTER — Encounter (HOSPITAL_COMMUNITY): Payer: Self-pay

## 2022-11-27 ENCOUNTER — Other Ambulatory Visit: Payer: Self-pay | Admitting: Student

## 2022-11-27 ENCOUNTER — Other Ambulatory Visit: Payer: Self-pay

## 2022-11-27 DIAGNOSIS — R590 Localized enlarged lymph nodes: Secondary | ICD-10-CM

## 2022-12-03 ENCOUNTER — Ambulatory Visit
Admission: RE | Admit: 2022-12-03 | Discharge: 2022-12-03 | Disposition: A | Discharge status: Home / Self Care | Payer: BC Managed Care – PPO | Source: Ambulatory Visit | Attending: Student | Admitting: Student

## 2022-12-03 DIAGNOSIS — R590 Localized enlarged lymph nodes: Secondary | ICD-10-CM

## 2022-12-03 MED ORDER — IOPAMIDOL (ISOVUE-300) INJECTION 61%
75.0000 mL | Freq: Once | INTRAVENOUS | Status: AC | PRN
  Administered 2022-12-03: 75 mL via INTRAVENOUS

## 2023-07-13 ENCOUNTER — Other Ambulatory Visit: Payer: Self-pay

## 2023-12-28 ENCOUNTER — Ambulatory Visit: Payer: BC Managed Care – PPO | Admitting: Physician Assistant

## 2023-12-28 NOTE — Progress Notes (Deleted)
 New patient visit  Patient: Gina Greer   DOB: 11/13/1980   43 y.o. Female  MRN: [id number] Visit Date: 12/28/2023  Today's healthcare provider: Debera Lat, PA-C   No chief complaint on file.  Subjective    Gina Greer is a 43 y.o. female who presents today as a new patient to establish care.  HPI  *** Discussed the use of AI scribe software for clinical note transcription with the patient, who gave verbal consent to proceed.  History of Present Illness     Past Medical History:  Diagnosis Date   Cavernous malformation    Chiari I malformation (HCC)    Family history of ovarian cancer    neg genetic testing in past   Hodgkin lymphoma (HCC)    age 68   Hypothyroidism (acquired)    off synthroid 2018 with normal TFTs of replacement   Infertility, female    Seizures (HCC)    Thyroid disease    underactive    Past Surgical History:  Procedure Laterality Date   BRAIN SURGERY     suboccipital craniectomy for Chairi I malformation decompression   DENTAL SURGERY     all top teeth removed age 75 and most of bottom teeth   THYROID SURGERY     bx; port insertion and removal   Family Status  Relation Name Status   Mother  Alive   Father  Deceased   Mat Aunt  Alive   MGM  Alive   MGF  Alive   Oceanographer  (Not Specified)  No partnership data on file   Family History  Adopted: Yes  Problem Relation Age of Onset   Cancer Mother        small cell lung cancer   Heart failure Father    Lung cancer Maternal Aunt 63   Ovarian cancer Maternal Grandmother 47   Dementia Maternal Grandmother    Kidney disease Maternal Grandfather    Ovarian cancer Paternal Aunt 3   Social History   Socioeconomic History   Marital status: Married    Spouse name: Not on file   Number of children: Not on file   Years of education: Not on file   Highest education level: Not on file  Occupational History   Not on file  Tobacco Use   Smoking status: Every Day    Current packs/day:  0.25    Types: Cigarettes   Smokeless tobacco: Never  Vaping Use   Vaping status: Never Used  Substance and Sexual Activity   Alcohol use: Not Currently   Drug use: Not Currently   Sexual activity: Yes    Birth control/protection: None  Other Topics Concern   Not on file  Social History Narrative   Not on file   Social Drivers of Health   Financial Resource Strain: Low Risk  (02/04/2022)   Received from [facility], [facility]   Financial Resource Strain    In the past year, have you or any family members you live with been unable to get any of the following when it was really needed? : No  Food Insecurity: Not on file  Transportation Needs: Low Risk  (02/04/2022)   Received from [facility], [facility]   Transportation Needs    Has lack of transportation kept you from medical appointments, meetings, work, or from getting things needed for daily living? : No  Physical Activity: Not on file  Stress: Not  on file  Social Connections: Low Risk  (02/04/2022)   Received from [facility], [facility]   Social Connections    How often do you see or talk to people that you care about and feel close to? (For example: talking to friends on the phone, visiting friends or family, going to church or club meetings): 5 or more times a week   Outpatient Medications Prior to Visit  Medication Sig   albuterol (VENTOLIN HFA) 108 (90 Base) MCG/ACT inhaler Inhale 2 puffs into the lungs as needed.   aspirin-acetaminophen-caffeine (EXCEDRIN MIGRAINE) 250-250-65 MG tablet Take 1 tablet by mouth as needed.   clomiPHENE (CLOMID) 50 MG tablet Take 1 tablet (50 mg total) by mouth daily.   folic acid (FOLVITE) 1 MG tablet Take 1 tablet (1 mg total) by mouth daily. (Patient not taking: No sig reported)   phenytoin (DILANTIN) 100 MG ER capsule 300 mg AM 100 mg afternoon and 300 mg  PM   ranitidine (ZANTAC) 150 MG capsule Take 150 mg by mouth daily.   sertraline (ZOLOFT) 100 MG tablet Take 200 mg by mouth daily.   topiramate (TOPAMAX) 100 MG tablet Take 1.5 tablets (150 mg total) by mouth 2 (two) times daily.   No facility-administered medications prior to visit.   Allergies  Allergen Reactions   Benadryl  [Diphenhydramine] Hives and Swelling   Fentanyl    Diazepam Nausea And Vomiting     There is no immunization history on file for this patient.  Health Maintenance  Topic Date Due   COVID-19 Vaccine (1) Never done   Pneumococcal Vaccine 11-35 Years old (1 of 2 - PCV) Never done   HIV Screening  Never done   Hepatitis C Screening  Never done   DTaP/Tdap/Td (1 - Tdap) Never done   INFLUENZA VACCINE  04/28/2024   Cervical Cancer Screening (HPV/Pap Cotest)  02/18/2025   HPV VACCINES  Aged Out    Patient Care Team: Center, [facility] as PCP - General (General Practice)  Review of Systems  All other systems reviewed and are negative.  Except see HPI   {Insert previous labs (optional):23779} {See past labs  Heme  Chem  Endocrine  Serology  Results Review (optional):1}   Objective    There were no vitals taken for this visit. {Insert last BP/Wt (optional):23777}{See vitals history (optional):1}   Physical Exam Vitals reviewed.  Constitutional:      General: She is not in acute distress.    Appearance: Normal appearance. She is well-developed. She is not diaphoretic.  HENT:     Head: Normocephalic and atraumatic.  Eyes:     General: No scleral icterus.    Conjunctiva/sclera: Conjunctivae normal.  Neck:     Thyroid: No thyromegaly.  Cardiovascular:     Rate and Rhythm: Normal rate and regular rhythm.     Pulses: Normal pulses.     Heart sounds: Normal heart sounds. No murmur heard. Pulmonary:     Effort: Pulmonary effort is normal. No respiratory distress.     Breath sounds: Normal breath sounds. No wheezing, rhonchi or  rales.  Musculoskeletal:     Cervical back: Neck supple.     Right lower leg: No edema.     Left lower leg: No edema.  Lymphadenopathy:     Cervical: No cervical adenopathy.  Skin:    General: Skin is warm and dry.     Findings: No rash.  Neurological:     Mental  Status: She is alert and oriented to person, place, and time. Mental status is at baseline.  Psychiatric:        Mood and Affect: Mood normal.        Behavior: Behavior normal.     Depression Screen     No data to display         No results found for any visits on 12/28/23.  Assessment & Plan     *** Assessment and Plan Assessment & Plan      Encounter to establish care Welcomed to our clinic Reviewed past medical hx, social hx, family hx and surgical hx Pt advised to send all vaccination records or screening   No follow-ups on file.    The patient was advised to call back or seek an in-person evaluation if the symptoms worsen or if the condition fails to improve as anticipated.  I discussed the assessment and treatment plan with the patient. The patient was provided an opportunity to ask questions and all were answered. The patient agreed with the plan and demonstrated an understanding of the instructions.  I, Debera Lat, PA-C have reviewed all documentation for this visit. The documentation on  12/28/2023   for the exam, diagnosis, procedures, and orders are all accurate and complete.  Debera Lat, [facility], MMS [facility] [phone] (phone) [phone] (fax)  [facility]

## 2024-08-29 ENCOUNTER — Ambulatory Visit
Admission: EM | Admit: 2024-08-29 | Discharge: 2024-08-29 | Disposition: A | Discharge status: Home / Self Care | Attending: Emergency Medicine | Admitting: Emergency Medicine

## 2024-08-29 ENCOUNTER — Encounter: Payer: Self-pay | Admitting: Emergency Medicine

## 2024-08-29 DIAGNOSIS — R350 Frequency of micturition: Secondary | ICD-10-CM | POA: Diagnosis not present

## 2024-08-29 DIAGNOSIS — N3000 Acute cystitis without hematuria: Secondary | ICD-10-CM | POA: Diagnosis present

## 2024-08-29 LAB — POCT URINE DIPSTICK
Bilirubin, UA: NEGATIVE
Glucose, UA: NEGATIVE mg/dL
Ketones, POC UA: NEGATIVE mg/dL
Nitrite, UA: POSITIVE — AB
POC PROTEIN,UA: 30 — AB
Spec Grav, UA: 1.02 (ref 1.010–1.025)
Urobilinogen, UA: 0.2 U/dL
pH, UA: 6 (ref 5.0–8.0)

## 2024-08-29 MED ORDER — CIPROFLOXACIN HCL 500 MG PO TABS: 500.0000 mg | ORAL_TABLET | Freq: Two times a day (BID) | ORAL | 0 refills | Status: AC

## 2024-08-29 MED ORDER — PHENAZOPYRIDINE HCL 200 MG PO TABS: 200.0000 mg | ORAL_TABLET | Freq: Three times a day (TID) | ORAL | 0 refills | Status: AC

## 2024-08-29 NOTE — Discharge Instructions (Signed)
 Your urinalysis shows Merlina Marchena blood cells and nitrates which are indicative of infection, your urine will be sent to the lab to determine exactly which bacteria is present, if any changes need to be made to your medications you will be notified  Begin use of ciprofloxacin twice daily for 5 days  You may use Pyridium every 8 hours as needed to help with discomfort  Increase your fluid intake through use of water  As always practice good hygiene, wiping front to back and avoidance of scented vaginal products to prevent further irritation  If symptoms continue to persist after use of medication or recur please follow-up with urgent care or your primary doctor as needed

## 2024-08-29 NOTE — ED Triage Notes (Signed)
 Patient reports bilateral flank pain, urinary frequency and painful urination x 1 month. Patient taking Ibuprofen with mild relief. Rates pain 4/10.

## 2024-08-29 NOTE — ED Provider Notes (Signed)
 [facility]    CSN: [id number] Arrival date & time: 08/29/24  1440      History   Chief Complaint Chief Complaint  Patient presents with   Flank Pain   Urinary Frequency   Dysuria    HPI Gina Greer is a 43 y.o. female.   Patient presents for evaluation of urinary frequency, dysuria, and hematuria present for 1 month, and experience bilateral low back pain beginning 4 to 5 days ago.  Was evaluated in [place] urgent care and prescribed cephalexin for E. coli, endorses improvement in symptoms but returned after 2 to 3 days.  Feels the symptoms are  worse at this time.  Denies vaginal symptoms or fever. hematuria has resolved.     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 Authorizing  Provider  albuterol (VENTOLIN HFA) 108 (90 Base) MCG/ACT inhaler Inhale 2 puffs into the lungs as needed. 12/28/18   [provider]  aspirin-acetaminophen-caffeine (EXCEDRIN MIGRAINE) 250-250-65 MG tablet Take 1 tablet by mouth as needed.    [provider]  clomiPHENE  (CLOMID ) 50 MG tablet Take 1 tablet (50 mg total) by mouth daily. 01/04/20   Arloa Lamar SQUIBB, MD  folic acid  (FOLVITE ) 1 MG tablet Take 1 tablet (1 mg total) by mouth daily. Patient not taking: No sig reported 08/02/19   Rilla Barnacle, CNM  phenytoin  (DILANTIN ) 100 MG ER capsule 300 mg AM 100 mg afternoon and 300 mg PM 05/05/20   Goodman, Graydon, MD  ranitidine (ZANTAC) 150 MG capsule Take 150 mg by mouth daily.    [provider]  sertraline (ZOLOFT) 100 MG tablet Take 200 mg by mouth daily.    [provider]  topiramate  (TOPAMAX ) 100 MG tablet Take 1.5 tablets (150 mg total) by mouth 2 (two) times daily. 05/05/20   Floy Roberts, MD    Family History Family  History  Adopted: Yes  Problem Relation Age of Onset   Cancer Mother        small cell lung cancer   Heart failure Father    Lung cancer Maternal Aunt 63   Ovarian cancer Maternal Grandmother 47   Dementia Maternal Grandmother    Kidney disease Maternal Grandfather    Ovarian cancer Paternal Aunt 92    Social History Social History   Tobacco Use   Smoking status: Every Day    Current packs/day: 0.25    Types: Cigarettes   Smokeless tobacco: Never  Vaping Use   Vaping status: Never Used  Substance Use Topics   Alcohol use: Not Currently   Drug use: Not Currently     Allergies   Benadryl  [diphenhydramine], Fentanyl , and Diazepam   Review of Systems Review of Systems  Constitutional: Negative.   Genitourinary:  Positive for dysuria, flank pain, frequency and hematuria. Negative for decreased urine volume, difficulty urinating, dyspareunia, enuresis, genital sores, menstrual problem, pelvic pain, urgency, vaginal  bleeding, vaginal discharge and vaginal pain.     Physical Exam Triage Vital Signs ED Triage Vitals  Encounter Vitals Group     BP 08/29/24 1533 123/84     Girls Systolic BP Percentile --      Girls Diastolic BP Percentile --      Boys Systolic BP Percentile --      Boys Diastolic BP Percentile --      Pulse Rate 08/29/24 1533 71     Resp 08/29/24 1533 20     Temp 08/29/24 1533 97.8 F (36.6 C)     Temp Source 08/29/24 1533 Oral     SpO2 08/29/24 1533 96 %     Weight --      Height --      Head Circumference --      Peak Flow --      Pain Score 08/29/24 1535 4     Pain Loc --      Pain Education --      Exclude from Growth Chart --    No data found.  Updated Vital Signs BP 123/84 (BP Location: Left Arm)   Pulse 71   Temp 97.8 F (36.6 C) (Oral)   Resp 20   LMP 07/11/2024 (Exact Date)   SpO2 96%   Visual Acuity Right Eye Distance:   Left Eye Distance:   Bilateral Distance:    Right Eye Near:   Left Eye Near:    Bilateral Near:     Physical Exam Constitutional:      Appearance: Normal appearance.  Abdominal:     Tenderness: There is abdominal tenderness in the suprapubic area. There is no right CVA tenderness, left CVA tenderness or guarding.  Neurological:     Mental Status: She is alert and oriented to person, place, and time.      UC Treatments / Results  Labs (all labs ordered are listed, but only abnormal results are displayed) Labs Reviewed  POCT URINE DIPSTICK - Abnormal; Notable for the following components:      Result Value   Clarity, UA cloudy (*)    Blood, UA moderate (*)    POC PROTEIN,UA =30 (*)    Nitrite, UA Positive (*)    Leukocytes, UA Large (3+) (*)    All other components within normal limits  URINE CULTURE    EKG   Radiology No results found.  Procedures Procedures (including critical care time)  Medications Ordered in  UC Medications - No data to display  Initial Impression / Assessment and Plan / UC Course  I  have reviewed the triage vital signs and the nursing notes.  Pertinent labs & imaging results that were available during my care of the patient were reviewed by me and considered in my medical decision making (see chart for details).  Urinary frequency, acute cystitis without hematuria  Urinalysis showing leukocytes and nitrates, sent for culture, last culture available showing E. coli, prescribed ciprofloxacin based on this, additionally prescribed Pyridium for comfort recommended nonpharmacological supportive care follow-up as needed Final Clinical Impressions(s) / UC Diagnoses   Final diagnoses:  Urinary frequency   Discharge Instructions   None    ED Prescriptions   None    PDMP not reviewed this encounter.   Teresa Shelba SAUNDERS, NP 08/29/24 1547

## 2024-08-31 ENCOUNTER — Ambulatory Visit (HOSPITAL_COMMUNITY): Payer: Self-pay

## 2024-08-31 LAB — URINE CULTURE: Culture: >=100000 — AB

## 2024-10-11 ENCOUNTER — Telehealth: Payer: Self-pay | Admitting: Family Medicine

## 2024-10-11 DIAGNOSIS — B9689 Other specified bacterial agents as the cause of diseases classified elsewhere: Secondary | ICD-10-CM

## 2024-10-11 DIAGNOSIS — J208 Acute bronchitis due to other specified organisms: Secondary | ICD-10-CM

## 2024-10-11 MED ORDER — DOXYCYCLINE HYCLATE 100 MG PO TABS: 100.0000 mg | ORAL_TABLET | Freq: Two times a day (BID) | ORAL | 0 refills | Status: AC

## 2024-10-11 MED ORDER — ALBUTEROL SULFATE HFA 108 (90 BASE) MCG/ACT IN AERS: 2.0000 | INHALATION_SPRAY | RESPIRATORY_TRACT | 0 refills | Status: AC | PRN

## 2024-10-11 MED ORDER — PREDNISONE 10 MG (21) PO TBPK: ORAL_TABLET | ORAL | 0 refills | Status: AC

## 2024-10-11 NOTE — Progress Notes (Signed)
 " Virtual Visit Consent   Gina Greer, you are scheduled for a virtual visit with a [facility] provider today. Just as with appointments in the office, your consent must be obtained to participate. Your consent will be active for this visit and any virtual visit you may have with one of our providers in the next 365 days. If you have a MyChart account, a copy of this consent can be sent to you electronically.  As this is a virtual visit, video technology does not allow for your provider to perform a traditional examination. This may limit your provider's ability to fully assess your condition. If your provider identifies any concerns that need to be evaluated in person or the need to arrange testing (such as labs, EKG, etc.), we will make arrangements to do so. Although advances in technology are sophisticated, we cannot ensure that it will always work on either your end or our end. If the connection with a video visit is poor, the visit may have to be switched to a telephone visit. With either a video or telephone visit, we are not always able to ensure that we have a secure connection.  By engaging in this virtual visit, you consent to the provision of healthcare and authorize for your insurance to be billed (if applicable) for the services provided during this visit. Depending on your insurance coverage, you may receive a charge related to this service.  I need to obtain your verbal consent now. Are you willing to proceed with your visit today? Gina Greer has provided verbal consent on 10/11/2024 for a virtual visit (video or telephone). Roosvelt Mater, [place]  Date: 10/11/2024 6:22 PM   Virtual Visit via Video Note   I, Roosvelt Mater, connected with  Gina Greer  ([id number], 05/27/1981) on 10/11/2024 at  6:15 PM EST by a video-enabled telemedicine application and verified that I am speaking with the correct person using two identifiers.  Location: Patient: Virtual Visit Location Patient:  Home Provider: Virtual Visit Location Provider: Home Office   I discussed the limitations of evaluation and management by telemedicine and the availability of in person appointments. The patient expressed understanding and agreed to proceed.    History of Present Illness: Gina Greer is a 44 y.o. who identifies as a female who was assigned female at birth, and is being seen today for c/o having a cough since new years eve.  Pt states using rescue inhaler and breathing treatments daily.  pt states she has a history of asthma.  Pt states had a fever for like a week but is now gone.  Pt states she has lack of energy and shortness of breath. Pt states she is coughing up yellow phlegm. Pt requests albuterol  inhaler refill.   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  Resting comfortably  at home.  Head is normocephalic, atraumatic.  No labored breathing.  Speech is clear and coherent with logical content.  Patient is alert and oriented at baseline.    Assessment and Plan: 1. Acute bacterial bronchitis (Primary) - doxycycline  (VIBRA -TABS) 100  MG tablet; Take 1 tablet (100 mg total) by mouth 2 (two) times daily for 7 days.  Dispense: 14 tablet; Refill: 0 - predniSONE  (STERAPRED UNI-PAK 21 TAB) 10 MG (21) TBPK tablet; Take following package directions.  Dispense: 21 tablet; Refill: 0 - albuterol  (VENTOLIN  HFA) 108 (90 Base) MCG/ACT inhaler; Inhale 2 puffs into the lungs as needed.  Dispense: 16 g; Refill: 0  -Pt advised if no improvement or  worsening symptoms to follow up with in person urgent care or PCP  -Pt verbalized understanding  Follow Up Instructions: I discussed the assessment and treatment plan with the patient. The patient was provided an opportunity to ask questions and all were answered. The patient agreed with the plan and demonstrated an understanding of the instructions.  A copy of instructions were sent to the patient via MyChart unless otherwise noted below.    The patient was advised to call back or seek an in-person evaluation if the symptoms worsen or if the condition fails to improve as anticipated.    Roosvelt Mater, PA-C     [1]  Allergies Allergen Reactions   Benadryl  [Diphenhydramine] Hives and Swelling   Fentanyl     Diazepam Nausea And Vomiting  [2]  Current Outpatient Medications:    doxycycline  (VIBRA -TABS) 100 MG tablet, Take 1 tablet (100 mg total) by mouth 2 (two) times daily for 7 days., Disp: 14 tablet, Rfl: 0   predniSONE  (STERAPRED UNI-PAK 21 TAB) 10 MG (21) TBPK tablet, Take following package directions., Disp: 21 tablet, Rfl: 0   albuterol  (VENTOLIN  HFA) 108 (90 Base) MCG/ACT inhaler, Inhale 2 puffs into the lungs as needed., Disp: 16 g, Rfl: 0   aspirin-acetaminophen-caffeine (EXCEDRIN MIGRAINE) 250-250-65 MG tablet, Take 1 tablet by mouth as needed., Disp: , Rfl:    ciprofloxacin  (CIPRO ) 500 MG tablet, Take 1 tablet (500 mg total) by mouth every 12 (twelve) hours., Disp: 10 tablet, Rfl: 0   clomiPHENE  (CLOMID ) 50 MG tablet, Take 1 tablet (50 mg total) by mouth daily., Disp: 5 tablet, Rfl: 0   folic acid  (FOLVITE ) 1 MG tablet, Take 1 tablet (1 mg total) by mouth daily. (Patient not taking: No sig reported), Disp: 30 tablet, Rfl: 10   phenazopyridine  (PYRIDIUM ) 200 MG tablet, Take 1 tablet (200 mg total) by mouth 3 (three) times daily., Disp: 6 tablet, Rfl: 0   phenytoin  (DILANTIN ) 100 MG ER capsule, 300 mg AM 100 mg afternoon and 300 mg PM, Disp: 100 capsule, Rfl: 2   ranitidine  (ZANTAC) 150 MG capsule, Take 150 mg by mouth daily., Disp: , Rfl:    sertraline (ZOLOFT) 100 MG tablet, Take 200 mg by mouth daily., Disp: , Rfl:    topiramate  (TOPAMAX ) 100 MG tablet, Take 1.5 tablets (150 mg total) by mouth 2 (two) times daily., Disp: 60 tablet, Rfl: 2  "

## 2024-10-11 NOTE — Patient Instructions (Signed)
 " Gina Greer, thank you for joining Roosvelt Mater, PA-C for today's virtual visit.  While this provider is not your primary care provider (PCP), if your PCP is located in our provider database this encounter information will be shared with them immediately following your visit.   A [facility] MyChart account gives you access to today's visit and all your visits, tests, and labs performed at [facility]  click here if you don't have a [facility] MyChart account or go to [email or web address]  Consent: (Patient) Gina Greer provided verbal consent for this virtual visit at the beginning of the encounter.  Current Medications:  Current Outpatient Medications:    doxycycline  (VIBRA -TABS) 100 MG tablet, Take 1 tablet (100 mg total) by mouth 2 (two) times daily for 7 days., Disp: 14 tablet, Rfl: 0   predniSONE  (STERAPRED UNI-PAK 21 TAB) 10 MG (21) TBPK tablet, Take following package directions., Disp: 21 tablet, Rfl: 0   albuterol  (VENTOLIN  HFA) 108 (90 Base) MCG/ACT inhaler, Inhale 2 puffs into the lungs as needed., Disp: 16 g, Rfl: 0   aspirin-acetaminophen-caffeine (EXCEDRIN MIGRAINE) 250-250-65 MG tablet, Take 1 tablet by mouth as needed., Disp: , Rfl:    ciprofloxacin  (CIPRO ) 500 MG tablet, Take 1 tablet (500 mg total) by mouth every 12 (twelve) hours., Disp: 10 tablet, Rfl: 0   clomiPHENE  (CLOMID ) 50 MG tablet, Take 1 tablet (50 mg total) by mouth daily., Disp: 5 tablet, Rfl: 0   folic acid  (FOLVITE ) 1 MG tablet, Take 1 tablet (1 mg total) by mouth daily. (Patient not taking: No sig reported), Disp: 30 tablet, Rfl: 10   phenazopyridine  (PYRIDIUM ) 200 MG tablet, Take 1 tablet (200 mg total) by mouth 3 (three) times daily., Disp: 6 tablet, Rfl: 0   phenytoin  (DILANTIN ) 100 MG ER capsule, 300 mg AM 100 mg afternoon and 300 mg PM, Disp: 100 capsule, Rfl: 2   ranitidine (ZANTAC) 150 MG capsule, Take 150 mg by mouth daily., Disp: , Rfl:    sertraline (ZOLOFT) 100 MG tablet, Take 200  mg by mouth daily., Disp: , Rfl:    topiramate  (TOPAMAX ) 100 MG tablet, Take 1.5 tablets (150 mg total) by mouth 2 (two) times daily., Disp: 60 tablet, Rfl: 2   Medications ordered in this encounter:  Meds ordered this encounter  Medications   doxycycline  (VIBRA -TABS) 100 MG tablet    Sig: Take 1 tablet (100 mg total) by mouth 2 (two) times daily for 7 days.    Dispense:  14 tablet    Refill:  0   predniSONE  (STERAPRED UNI-PAK 21 TAB) 10 MG (21) TBPK tablet    Sig: Take following package directions.    Dispense:  21 tablet    Refill:  0    Please dispense one standard blister pack taper.   albuterol  (VENTOLIN  HFA) 108 (90 Base) MCG/ACT inhaler    Sig: Inhale 2 puffs into the lungs as needed.    Dispense:  16 g    Refill:  0     *If you need refills on other medications prior to your next appointment, please contact your pharmacy*  Follow-Up: Call back or seek an in-person evaluation if the symptoms worsen or if the condition fails to improve as anticipated.   Virtual Care [phone]  Other Instructions Acute Bronchitis, Adult  Acute bronchitis is sudden inflammation of the main airways (bronchi) that come off the windpipe (trachea) in the lungs. The swelling causes the airways to get smaller and make more  mucus than normal. This can make it hard to breathe and can cause coughing or noisy breathing (wheezing). Acute bronchitis may last several weeks. The cough may last longer. Allergies, asthma, and exposure to smoke may make the condition worse. What are the causes? This condition can be caused by germs and by substances that irritate the lungs, including: Cold and flu viruses. The most common cause of this condition is the virus that causes the common cold. Bacteria. This is less common. Breathing in substances that irritate the lungs, including: Smoke from cigarettes and other forms of tobacco. Dust and pollen. Fumes from household cleaning products, gases, or  burned fuel. Indoor or outdoor air pollution. What increases the risk? The following factors may make you more likely to develop this condition: A weak body's defense system, also called the immune system. A condition that affects your lungs and breathing, such as asthma. What are the signs or symptoms? Common symptoms of this condition include: Coughing. This may bring up clear, yellow, or green mucus from your lungs (sputum). Wheezing. Runny or stuffy nose. Having too much mucus in your lungs (chest congestion). Shortness of breath. Aches and pains, including sore throat or chest. How is this diagnosed? This condition is usually diagnosed based on: Your symptoms and medical history. A physical exam. You may also have other tests, including tests to rule out other conditions, such as pneumonia. These tests include: A test of lung function. Test of a mucus sample to look for the presence of bacteria. Tests to check the oxygen level in your blood. Blood tests. Chest X-ray. How is this treated? Most cases of acute bronchitis clear up over time without treatment. Your health care provider may recommend: Drinking more fluids to help thin your mucus so it is easier to cough up. Taking inhaled medicine (inhaler) to improve air flow in and out of your lungs. Using a vaporizer or a humidifier. These are machines that add water to the air to help you breathe better. Taking a medicine that thins mucus and clears congestion (expectorant). Taking a medicine that prevents or stops coughing (cough suppressant). It is not common to take an antibiotic medicine for this condition. Follow these instructions at home:  Take over-the-counter and prescription medicines only as told by your health care provider. Use an inhaler, vaporizer, or humidifier as told by your health care provider. Take two teaspoons (10 mL) of honey at bedtime to lessen coughing at night. Drink enough fluid to keep your urine  pale yellow. Do not use any products that contain nicotine or tobacco. These products include cigarettes, chewing tobacco, and vaping devices, such as e-cigarettes. If you need help quitting, ask your health care provider. Get plenty of rest. Return to your normal activities as told by your health care provider. Ask your health care provider what activities are safe for you. Keep all follow-up visits. This is important. How is this prevented? To lower your risk of getting this condition again: Wash your hands often with soap and water for at least 20 seconds. If soap and water are not available, use hand sanitizer. Avoid contact with people who have cold symptoms. Try not to touch your mouth, nose, or eyes with your hands. Avoid breathing in smoke or chemical fumes. Breathing smoke or chemical fumes will make your condition worse. Get the flu shot every year. Contact a health care provider if: Your symptoms do not improve after 2 weeks. You have trouble coughing up the mucus. Your cough keeps  you awake at night. You have a fever. Get help right away if you: Cough up blood. Feel pain in your chest. Have severe shortness of breath. Faint or keep feeling like you are going to faint. Have a severe headache. Have a fever or chills that get worse. These symptoms may represent a serious problem that is an emergency. Do not wait to see if the symptoms will go away. Get medical help right away. Call your local emergency services (911 in the [place] Do not drive yourself to the hospital. Summary Acute bronchitis is inflammation of the main airways (bronchi) that come off the windpipe (trachea) in the lungs. The swelling causes the airways to get smaller and make more mucus than normal. Drinking more fluids can help thin your mucus so it is easier to cough up. Take over-the-counter and prescription medicines only as told by your health care provider. Do not use any products that contain nicotine or  tobacco. These products include cigarettes, chewing tobacco, and vaping devices, such as e-cigarettes. If you need help quitting, ask your health care provider. Contact a health care provider if your symptoms do not improve after 2 weeks. This information is not intended to replace advice given to you by your health care provider. Make sure you discuss any questions you have with your health care provider. Document Revised: 12/25/2021 Document Reviewed: 01/15/2021 Elsevier Patient Education  2024 [employer]   If you have been instructed to have an in-person evaluation today at a local Urgent Care facility, please use the link below. It will take you to a list of all of our available [facility], including address, phone number and hours of operation. Please do not delay care.  [facility]  If you or a family member do not have a primary care provider, use the link below to schedule a visit and establish care. When you choose a [facility] primary care physician or advanced practice provider, you gain a long-term partner in health. Find a Primary Care Provider  Learn more about [facility]'s in-office and virtual care options: [facility] - Get Care Now  "
